# Patient Record
Sex: Male | Born: 1970 | Race: Black or African American | Hispanic: No | Marital: Single | Smoking: Current some day smoker
Health system: Southern US, Community
[De-identification: ages and names within clinical notes are randomized; demographics above are authoritative.]

## PROBLEM LIST (undated history)

## (undated) DIAGNOSIS — M549 Dorsalgia, unspecified: Secondary | ICD-10-CM

---

## 2008-04-13 ENCOUNTER — Emergency Department (HOSPITAL_COMMUNITY): Admission: EM | Admit: 2008-04-13 | Discharge: 2008-04-13 | Payer: Self-pay | Admitting: Family Medicine

## 2008-05-24 ENCOUNTER — Emergency Department (HOSPITAL_COMMUNITY): Admission: EM | Admit: 2008-05-24 | Discharge: 2008-05-24 | Payer: Self-pay | Admitting: Emergency Medicine

## 2008-06-25 ENCOUNTER — Emergency Department (HOSPITAL_COMMUNITY): Admission: EM | Admit: 2008-06-25 | Discharge: 2008-06-25 | Payer: Self-pay | Admitting: Emergency Medicine

## 2008-06-29 ENCOUNTER — Emergency Department (HOSPITAL_COMMUNITY): Admission: EM | Admit: 2008-06-29 | Discharge: 2008-06-29 | Payer: Self-pay | Admitting: Emergency Medicine

## 2008-07-31 ENCOUNTER — Emergency Department (HOSPITAL_COMMUNITY): Admission: EM | Admit: 2008-07-31 | Discharge: 2008-07-31 | Payer: Self-pay | Admitting: Emergency Medicine

## 2008-12-24 ENCOUNTER — Emergency Department (HOSPITAL_COMMUNITY): Admission: EM | Admit: 2008-12-24 | Discharge: 2008-12-24 | Payer: Self-pay | Admitting: Family Medicine

## 2009-01-02 ENCOUNTER — Emergency Department (HOSPITAL_COMMUNITY): Admission: EM | Admit: 2009-01-02 | Discharge: 2009-01-02 | Payer: Self-pay | Admitting: Emergency Medicine

## 2009-03-02 ENCOUNTER — Emergency Department (HOSPITAL_COMMUNITY): Admission: EM | Admit: 2009-03-02 | Discharge: 2009-03-02 | Payer: Self-pay | Admitting: Family Medicine

## 2009-07-20 ENCOUNTER — Emergency Department (HOSPITAL_COMMUNITY): Admission: EM | Admit: 2009-07-20 | Discharge: 2009-07-20 | Payer: Self-pay | Admitting: Emergency Medicine

## 2009-09-03 ENCOUNTER — Emergency Department (HOSPITAL_COMMUNITY): Admission: EM | Admit: 2009-09-03 | Discharge: 2009-09-03 | Payer: Self-pay | Admitting: Emergency Medicine

## 2009-09-08 ENCOUNTER — Emergency Department (HOSPITAL_COMMUNITY): Admission: EM | Admit: 2009-09-08 | Discharge: 2009-09-08 | Payer: Self-pay | Admitting: Emergency Medicine

## 2009-09-21 ENCOUNTER — Emergency Department (HOSPITAL_COMMUNITY): Admission: EM | Admit: 2009-09-21 | Discharge: 2009-09-21 | Payer: Self-pay | Admitting: Emergency Medicine

## 2010-02-10 ENCOUNTER — Emergency Department (HOSPITAL_COMMUNITY): Admission: EM | Admit: 2010-02-10 | Discharge: 2010-02-10 | Payer: Self-pay | Admitting: Family Medicine

## 2010-02-24 ENCOUNTER — Emergency Department (HOSPITAL_COMMUNITY): Admission: EM | Admit: 2010-02-24 | Discharge: 2010-02-24 | Payer: Self-pay | Admitting: Emergency Medicine

## 2010-02-26 ENCOUNTER — Emergency Department (HOSPITAL_COMMUNITY): Admission: EM | Admit: 2010-02-26 | Discharge: 2010-02-27 | Payer: Self-pay | Admitting: Emergency Medicine

## 2010-03-03 ENCOUNTER — Emergency Department (HOSPITAL_COMMUNITY): Admission: EM | Admit: 2010-03-03 | Discharge: 2010-03-03 | Payer: Self-pay | Admitting: Family Medicine

## 2010-03-11 ENCOUNTER — Emergency Department (HOSPITAL_COMMUNITY): Admission: EM | Admit: 2010-03-11 | Discharge: 2010-03-11 | Payer: Self-pay | Admitting: Emergency Medicine

## 2010-03-15 ENCOUNTER — Emergency Department (HOSPITAL_COMMUNITY): Admission: EM | Admit: 2010-03-15 | Discharge: 2010-03-15 | Payer: Self-pay | Admitting: Family Medicine

## 2010-03-17 ENCOUNTER — Emergency Department (HOSPITAL_COMMUNITY): Admission: EM | Admit: 2010-03-17 | Discharge: 2010-03-17 | Payer: Self-pay | Admitting: Emergency Medicine

## 2010-04-02 ENCOUNTER — Emergency Department (HOSPITAL_COMMUNITY): Admission: EM | Admit: 2010-04-02 | Discharge: 2010-04-02 | Payer: Self-pay | Admitting: Family Medicine

## 2010-08-22 ENCOUNTER — Emergency Department (HOSPITAL_COMMUNITY): Admission: EM | Admit: 2010-08-22 | Discharge: 2010-08-22 | Payer: Self-pay | Admitting: Family Medicine
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
## 2010-12-13 ENCOUNTER — Emergency Department (HOSPITAL_COMMUNITY)
Admission: EM | Admit: 2010-12-13 | Discharge: 2010-12-13 | Payer: Self-pay | Source: Home / Self Care | Admitting: Emergency Medicine

## 2011-01-10 ENCOUNTER — Inpatient Hospital Stay (INDEPENDENT_AMBULATORY_CARE_PROVIDER_SITE_OTHER)
Admission: RE | Admit: 2011-01-10 | Discharge: 2011-01-10 | Disposition: A | Discharge status: Home / Self Care | Payer: Self-pay | Source: Ambulatory Visit | Attending: Family Medicine | Admitting: Family Medicine

## 2011-01-10 DIAGNOSIS — K047 Periapical abscess without sinus: Secondary | ICD-10-CM

## 2011-02-04 LAB — GC/CHLAMYDIA PROBE AMP, GENITAL
Chlamydia, DNA Probe: NEGATIVE
GC Probe Amp, Genital: NEGATIVE

## 2011-02-09 LAB — CULTURE, ROUTINE-ABSCESS

## 2011-02-14 LAB — DIFFERENTIAL
Basophils Relative: 1 % (ref 0–1)
Eosinophils Absolute: 0.1 10*3/uL (ref 0.0–0.7)
Lymphs Abs: 2 10*3/uL (ref 0.7–4.0)
Monocytes Relative: 8 % (ref 3–12)

## 2011-02-14 LAB — POCT URINALYSIS DIP (DEVICE)
Bilirubin Urine: NEGATIVE
Glucose, UA: NEGATIVE mg/dL
Hgb urine dipstick: NEGATIVE
Nitrite: NEGATIVE
Protein, ur: NEGATIVE mg/dL
Specific Gravity, Urine: 1.025 (ref 1.005–1.030)
pH: 5.5 (ref 5.0–8.0)

## 2011-02-14 LAB — POCT I-STAT, CHEM 8
Creatinine, Ser: 1.1 mg/dL (ref 0.4–1.5)
Glucose, Bld: 111 mg/dL — ABNORMAL HIGH (ref 70–99)
HCT: 44 % (ref 39.0–52.0)

## 2011-02-14 LAB — RAPID URINE DRUG SCREEN, HOSP PERFORMED
Barbiturates: NOT DETECTED
Cocaine: NOT DETECTED
Tetrahydrocannabinol: POSITIVE — AB

## 2011-02-14 LAB — HIV ANTIBODY (ROUTINE TESTING W REFLEX): HIV: NONREACTIVE

## 2011-02-14 LAB — CBC
MCV: 100 fL (ref 78.0–100.0)
Platelets: 153 10*3/uL (ref 150–400)
WBC: 4.6 10*3/uL (ref 4.0–10.5)

## 2011-02-14 LAB — GC/CHLAMYDIA PROBE AMP, GENITAL
Chlamydia, DNA Probe: NEGATIVE
GC Probe Amp, Genital: NEGATIVE

## 2011-03-03 LAB — GC/CHLAMYDIA PROBE AMP, GENITAL
Chlamydia, DNA Probe: NEGATIVE
GC Probe Amp, Genital: NEGATIVE

## 2011-03-09 LAB — GC/CHLAMYDIA PROBE AMP, GENITAL
Chlamydia, DNA Probe: NEGATIVE
GC Probe Amp, Genital: NEGATIVE

## 2011-03-09 LAB — RPR: RPR Ser Ql: NONREACTIVE

## 2011-03-13 ENCOUNTER — Inpatient Hospital Stay (INDEPENDENT_AMBULATORY_CARE_PROVIDER_SITE_OTHER)
Admission: RE | Admit: 2011-03-13 | Discharge: 2011-03-13 | Disposition: A | Discharge status: Home / Self Care | Payer: Self-pay | Source: Ambulatory Visit | Attending: Family Medicine | Admitting: Family Medicine

## 2011-03-13 DIAGNOSIS — K089 Disorder of teeth and supporting structures, unspecified: Secondary | ICD-10-CM

## 2011-04-11 ENCOUNTER — Inpatient Hospital Stay (INDEPENDENT_AMBULATORY_CARE_PROVIDER_SITE_OTHER)
Admission: RE | Admit: 2011-04-11 | Discharge: 2011-04-11 | Disposition: A | Discharge status: Home / Self Care | Payer: Self-pay | Source: Ambulatory Visit | Attending: Emergency Medicine | Admitting: Emergency Medicine

## 2011-04-11 DIAGNOSIS — M799 Soft tissue disorder, unspecified: Secondary | ICD-10-CM

## 2011-04-11 DIAGNOSIS — R070 Pain in throat: Secondary | ICD-10-CM

## 2011-07-25 ENCOUNTER — Inpatient Hospital Stay (INDEPENDENT_AMBULATORY_CARE_PROVIDER_SITE_OTHER)
Admission: RE | Admit: 2011-07-25 | Discharge: 2011-07-25 | Disposition: A | Discharge status: Home / Self Care | Payer: Self-pay | Source: Ambulatory Visit | Attending: Family Medicine | Admitting: Family Medicine

## 2011-07-25 DIAGNOSIS — K589 Irritable bowel syndrome without diarrhea: Secondary | ICD-10-CM

## 2011-07-25 LAB — POCT I-STAT, CHEM 8
Calcium, Ion: 1.13 mmol/L (ref 1.12–1.32)
Chloride: 108 mEq/L (ref 96–112)
HCT: 40 % (ref 39.0–52.0)
Potassium: 4 mEq/L (ref 3.5–5.1)
Sodium: 143 mEq/L (ref 135–145)
TCO2: 24 mmol/L (ref 0–100)

## 2011-07-25 LAB — OCCULT BLOOD, POC DEVICE: Fecal Occult Bld: NEGATIVE

## 2011-08-14 ENCOUNTER — Inpatient Hospital Stay (INDEPENDENT_AMBULATORY_CARE_PROVIDER_SITE_OTHER)
Admission: RE | Admit: 2011-08-14 | Discharge: 2011-08-14 | Disposition: A | Discharge status: Home / Self Care | Payer: Self-pay | Source: Ambulatory Visit | Attending: Emergency Medicine | Admitting: Emergency Medicine

## 2011-08-14 DIAGNOSIS — K029 Dental caries, unspecified: Secondary | ICD-10-CM

## 2011-09-21 ENCOUNTER — Inpatient Hospital Stay (INDEPENDENT_AMBULATORY_CARE_PROVIDER_SITE_OTHER)
Admission: RE | Admit: 2011-09-21 | Discharge: 2011-09-21 | Disposition: A | Discharge status: Home / Self Care | Payer: Self-pay | Source: Ambulatory Visit | Attending: Family Medicine | Admitting: Family Medicine

## 2011-09-21 DIAGNOSIS — K089 Disorder of teeth and supporting structures, unspecified: Secondary | ICD-10-CM

## 2011-09-21 DIAGNOSIS — K029 Dental caries, unspecified: Secondary | ICD-10-CM

## 2011-09-21 DIAGNOSIS — S025XXA Fracture of tooth (traumatic), initial encounter for closed fracture: Secondary | ICD-10-CM

## 2011-11-02 ENCOUNTER — Emergency Department (HOSPITAL_COMMUNITY)
Admission: EM | Admit: 2011-11-02 | Discharge: 2011-11-02 | Discharge status: Against Medical Advice | Payer: No Typology Code available for payment source

## 2011-11-02 ENCOUNTER — Encounter: Payer: Self-pay | Admitting: Emergency Medicine

## 2011-11-02 ENCOUNTER — Emergency Department (HOSPITAL_COMMUNITY)
Admission: EM | Admit: 2011-11-02 | Discharge: 2011-11-03 | Disposition: A | Discharge status: Home / Self Care | Payer: No Typology Code available for payment source | Attending: Emergency Medicine | Admitting: Emergency Medicine

## 2011-11-02 DIAGNOSIS — J111 Influenza due to unidentified influenza virus with other respiratory manifestations: Secondary | ICD-10-CM | POA: Insufficient documentation

## 2011-11-02 DIAGNOSIS — S199XXA Unspecified injury of neck, initial encounter: Secondary | ICD-10-CM | POA: Insufficient documentation

## 2011-11-02 DIAGNOSIS — IMO0001 Reserved for inherently not codable concepts without codable children: Secondary | ICD-10-CM | POA: Insufficient documentation

## 2011-11-02 DIAGNOSIS — R509 Fever, unspecified: Secondary | ICD-10-CM | POA: Insufficient documentation

## 2011-11-02 DIAGNOSIS — F17200 Nicotine dependence, unspecified, uncomplicated: Secondary | ICD-10-CM | POA: Insufficient documentation

## 2011-11-02 DIAGNOSIS — S0993XA Unspecified injury of face, initial encounter: Secondary | ICD-10-CM | POA: Insufficient documentation

## 2011-11-02 DIAGNOSIS — M542 Cervicalgia: Secondary | ICD-10-CM | POA: Insufficient documentation

## 2011-11-02 DIAGNOSIS — M549 Dorsalgia, unspecified: Secondary | ICD-10-CM | POA: Insufficient documentation

## 2011-11-02 DIAGNOSIS — R51 Headache: Secondary | ICD-10-CM | POA: Insufficient documentation

## 2011-11-02 DIAGNOSIS — R61 Generalized hyperhidrosis: Secondary | ICD-10-CM | POA: Insufficient documentation

## 2011-11-03 ENCOUNTER — Emergency Department (HOSPITAL_COMMUNITY): Payer: Self-pay

## 2011-11-03 ENCOUNTER — Emergency Department (HOSPITAL_COMMUNITY): Payer: No Typology Code available for payment source

## 2011-11-03 ENCOUNTER — Encounter (HOSPITAL_COMMUNITY): Payer: Self-pay | Admitting: Radiology

## 2011-11-03 MED ORDER — OSELTAMIVIR PHOSPHATE 75 MG PO CAPS
75.0000 mg | ORAL_CAPSULE | ORAL | Status: AC
  Administered 2011-11-03: 75 mg via ORAL
  Filled 2011-11-03: qty 1

## 2011-11-03 MED ORDER — OSELTAMIVIR PHOSPHATE 75 MG PO CAPS: 75.0000 mg | ORAL_CAPSULE | Freq: Two times a day (BID) | ORAL | Status: AC

## 2011-11-03 MED ORDER — ONDANSETRON 4 MG PO TBDP
4.0000 mg | ORAL_TABLET | Freq: Once | ORAL | Status: AC
  Administered 2011-11-03: 4 mg via ORAL
  Filled 2011-11-03: qty 1

## 2011-11-03 MED ORDER — IBUPROFEN 800 MG PO TABS
800.0000 mg | ORAL_TABLET | Freq: Once | ORAL | Status: DC
  Filled 2011-11-03: qty 1

## 2011-11-03 MED ORDER — OXYCODONE HCL 5 MG PO TABS: 5.0000 mg | ORAL_TABLET | ORAL | Status: AC | PRN

## 2011-11-03 MED ORDER — MORPHINE SULFATE 4 MG/ML IJ SOLN
6.0000 mg | Freq: Once | INTRAMUSCULAR | Status: AC
  Administered 2011-11-03: 4 mg via INTRAVENOUS
  Filled 2011-11-03: qty 1
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
## 2011-11-17 NOTE — ED Provider Notes (Signed)
Medical screening examination/treatment/procedure(s) were performed by non-physician practitioner and as supervising physician I was immediately available for consultation/collaboration.   Catina Nuss E Marieanne Marxen, MD 11/17/11 0736 

## 2012-03-25 ENCOUNTER — Emergency Department (INDEPENDENT_AMBULATORY_CARE_PROVIDER_SITE_OTHER)
Admission: EM | Admit: 2012-03-25 | Discharge: 2012-03-25 | Disposition: A | Discharge status: Nursing Home (Includes ICF) | Payer: Self-pay | Source: Home / Self Care | Attending: Family Medicine | Admitting: Family Medicine

## 2012-03-25 ENCOUNTER — Emergency Department (HOSPITAL_COMMUNITY)
Admission: EM | Admit: 2012-03-25 | Discharge: 2012-03-25 | Disposition: A | Discharge status: Home / Self Care | Payer: Self-pay | Attending: Emergency Medicine | Admitting: Emergency Medicine

## 2012-03-25 ENCOUNTER — Emergency Department (HOSPITAL_COMMUNITY): Payer: Self-pay

## 2012-03-25 ENCOUNTER — Encounter (HOSPITAL_COMMUNITY): Payer: Self-pay | Admitting: *Deleted

## 2012-03-25 ENCOUNTER — Encounter (HOSPITAL_COMMUNITY): Payer: Self-pay | Admitting: Emergency Medicine

## 2012-03-25 DIAGNOSIS — R109 Unspecified abdominal pain: Secondary | ICD-10-CM | POA: Insufficient documentation

## 2012-03-25 DIAGNOSIS — G8929 Other chronic pain: Secondary | ICD-10-CM | POA: Insufficient documentation

## 2012-03-25 LAB — URINALYSIS, ROUTINE W REFLEX MICROSCOPIC
Ketones, ur: 40 mg/dL — AB
Leukocytes, UA: NEGATIVE
Nitrite: NEGATIVE
Protein, ur: NEGATIVE mg/dL
Urobilinogen, UA: 0.2 mg/dL (ref 0.0–1.0)

## 2012-03-25 LAB — CBC
HCT: 40.7 % (ref 39.0–52.0)
Hemoglobin: 14.6 g/dL (ref 13.0–17.0)
MCH: 33.8 pg (ref 26.0–34.0)
MCHC: 35.9 g/dL (ref 30.0–36.0)
RBC: 4.32 MIL/uL (ref 4.22–5.81)

## 2012-03-25 LAB — COMPREHENSIVE METABOLIC PANEL
Alkaline Phosphatase: 63 U/L (ref 39–117)
BUN: 14 mg/dL (ref 6–23)
Chloride: 105 mEq/L (ref 96–112)
Creatinine, Ser: 1 mg/dL (ref 0.50–1.35)
GFR calc Af Amer: >90 mL/min (ref >90)
Glucose, Bld: 68 mg/dL — ABNORMAL LOW (ref 70–99)
Potassium: 5.1 mEq/L (ref 3.5–5.1)
Total Bilirubin: 0.5 mg/dL (ref 0.3–1.2)
Total Protein: 7 g/dL (ref 6.0–8.3)

## 2012-03-25 LAB — DIFFERENTIAL
Basophils Relative: 0 % (ref 0–1)
Eosinophils Absolute: 0 10*3/uL (ref 0.0–0.7)
Lymphs Abs: 2.8 10*3/uL (ref 0.7–4.0)
Monocytes Absolute: 0.5 10*3/uL (ref 0.1–1.0)
Monocytes Relative: 10 % (ref 3–12)
Neutrophils Relative %: 37 % — ABNORMAL LOW (ref 43–77)

## 2012-03-25 MED ORDER — IOHEXOL 300 MG/ML  SOLN
100.0000 mL | Freq: Once | INTRAMUSCULAR | Status: AC | PRN
  Administered 2012-03-25: 100 mL via INTRAVENOUS

## 2012-03-25 MED ORDER — SODIUM CHLORIDE 0.9 % IV SOLN
Freq: Once | INTRAVENOUS | Status: AC
  Administered 2012-03-25: 15:00:00 via INTRAVENOUS

## 2012-03-25 MED ORDER — SODIUM CHLORIDE 0.9 % IV BOLUS (SEPSIS)
1000.0000 mL | Freq: Once | INTRAVENOUS | Status: AC
  Administered 2012-03-25: 1000 mL via INTRAVENOUS

## 2012-03-25 MED ORDER — IOHEXOL 300 MG/ML  SOLN
20.0000 mL | INTRAMUSCULAR | Status: AC
  Administered 2012-03-25 (×2): 20 mL via ORAL

## 2012-03-25 MED ORDER — MORPHINE SULFATE 4 MG/ML IJ SOLN
4.0000 mg | Freq: Once | INTRAMUSCULAR | Status: AC
  Administered 2012-03-25: 4 mg via INTRAVENOUS
  Filled 2012-03-25: qty 1

## 2012-03-25 MED ORDER — OXYCODONE HCL 5 MG PO TABS: 5.0000 mg | ORAL_TABLET | Freq: Four times a day (QID) | ORAL | Status: AC | PRN

## 2012-03-25 NOTE — ED Notes (Signed)
Patient in bathroom

## 2012-03-25 NOTE — ED Notes (Signed)
Patient placed in gown for physician examination

## 2012-03-25 NOTE — Discharge Instructions (Signed)
Transferred to [facility] Emergency Department via shuttle for further evaluation of abdominal pain.

## 2012-03-25 NOTE — Discharge Instructions (Signed)
Follow-up with the GI doctor provided.  Return here as needed. °

## 2012-03-25 NOTE — ED Provider Notes (Signed)
  Physical Exam  BP 129/62  Pulse 50  Temp(Src) 97.9 F (36.6 C) (Oral)  Resp 18  SpO2 99%  Physical Exam  ED Course  Procedures  Patient will be referred to GI. Told to return here as needed. He is given the plan and all questions answered. Patient has eaten here as well.     Carlyle Dolly, PA-C 03/25/12 1758

## 2012-03-25 NOTE — ED Notes (Signed)
Pt up to bathroom to give urine sample

## 2012-03-25 NOTE — ED Notes (Signed)
To ED for eval of abd pain and diarrhea since Dec. Sent from [facility] for further eval.

## 2012-03-25 NOTE — ED Notes (Addendum)
Patient reports intermittent abdominal pain for 2 weeks, pain worsened 3 days ago and became constant.  Patient reports diarrhea for a month.  Reports 3 episodes of diarrhea today.  Reports dark urine and difficulty starting urination reports 25 pound weight loss on last month.  Reports eating results in having a diarrhea stool.  Reports green/red stool

## 2012-03-25 NOTE — ED Provider Notes (Signed)
History     CSN: [id number]  Arrival date & time 03/25/12  1021   First MD Initiated Contact with Patient 03/25/12 1049      Chief Complaint  Patient presents with  . Abdominal Pain    (Consider location/radiation/quality/duration/timing/severity/associated sxs/prior treatment) HPI Comments: Roy Barry presents for evaluation of left-sided abdominal pain. He reports onset of symptoms, at first, over the last 3 weeks, with worsening over the last 3 days. Under further interview, he reports onset of symptoms in December. He states that they have subsided and improved somewhat over that time. He now reports recurrence of symptoms over the last 3 weeks, with constant and persistent pain. He reports multiple watery, loose bowel movements daily. He also now reports seeing blood intermittently with these bowel movements. He denies any nausea or vomiting and is eating and drinking well. He also continues to urinate well. He denies any fever. He said the pain is worsened with eating, and sometimes relieved with defecation. He denies any prior history.  Patient is a 41 y.o. male presenting with abdominal pain.  Abdominal Pain The primary symptoms of the illness include abdominal pain and diarrhea. The primary symptoms of the illness do not include fever, nausea, vomiting or hematemesis. The current episode started more than 2 days ago. The onset of the illness was sudden. The problem has been gradually worsening.  The abdominal pain began more than 2 days ago. The pain came on suddenly. The abdominal pain has been gradually worsening since its onset. The abdominal pain is located in the LUQ and LLQ. The abdominal pain does not radiate. The abdominal pain is relieved by bowel movement. The abdominal pain is exacerbated by eating.  The diarrhea began more than 1 week ago. The diarrhea is watery, blood-tinged and semi-solid. The diarrhea occurs 5 to 10 times per day.    History reviewed. No pertinent past  medical history.  History reviewed. No pertinent past surgical history.  No family history on file.  History  Substance Use Topics  . Smoking status: Current Everyday Smoker  . Smokeless tobacco: Not on file  . Alcohol Use: Yes     occassional      Review of Systems  Constitutional: Negative.  Negative for fever.  HENT: Negative.   Eyes: Negative.   Respiratory: Negative.   Cardiovascular: Negative.   Gastrointestinal: Positive for abdominal pain, diarrhea and blood in stool. Negative for nausea, vomiting and hematemesis.  Genitourinary: Negative.   Musculoskeletal: Negative.   Skin: Negative.   Neurological: Negative.     Allergies  Aspirin; Ibuprofen; Thorazine; and Tylenol  Home Medications  No current outpatient prescriptions on file.  BP 127/79  Pulse 68  Temp(Src) 98 F (36.7 C) (Oral)  Resp 20  SpO2 98%  Physical Exam  Nursing note and vitals reviewed. Constitutional: He is oriented to person, place, and time. He appears well-developed and well-nourished.  HENT:  Head: Normocephalic and atraumatic.  Eyes: EOM are normal.  Neck: Normal range of motion.  Pulmonary/Chest: Effort normal.  Abdominal: Soft. Normal appearance and bowel sounds are normal. There is tenderness in the left upper quadrant and left lower quadrant. There is no rebound and no guarding.  Musculoskeletal: Normal range of motion.  Neurological: He is alert and oriented to person, place, and time.  Skin: Skin is warm and dry.  Psychiatric: His behavior is normal.    ED Course  Procedures (including critical care time)  Labs Reviewed - No data to display No results found.  1. Abdominal pain       MDM  Transferred to ED for further evaluation        Renaee Munda, MD 03/25/12 1233

## 2012-03-25 NOTE — ED Provider Notes (Signed)
History   This chart was scribed for Nat Christen, MD by Melba Coon. The patient was seen in room STRE3/STRE3 and the patient's care was started at 2:10PM.    CSN: [id number]  Arrival date & time 03/25/12  1302   First MD Initiated Contact with Patient 03/25/12 1407      Chief Complaint  Patient presents with  . Abdominal Pain    (Consider location/radiation/quality/duration/timing/severity/associated sxs/prior treatment) HPI Roy Barry is a 41 y.o. male who presents to the Emergency Department complaining of constant, moderate to severe abdominal pain with an onset 3 days ago. Pt was over at Urgent Care earlier today and was sent over here to the ED. Pt has had chronic abd pain since December but has gotten progressively worse since onset. Pt states that he "uses the bathroom 7 times a day" and that he has lost about 25 lbs over the last month. Decreased amount of food consumption, but he states he wants to eat. No OTC meds taken at home. Slight hematochezia present. No hematuria. No HA, fever, neck pain, sore throat, rash, back pain, CP, SOB, emesis, dysuria, or extremity pain, edema, weakness, numbness, or tingling. No Hx of abd surgeries. Pt is allergic to ASA, ibuprofen, thorazine, and Tylenol. No other pertinent medical symptoms.  History reviewed. No pertinent past medical history.  No past surgical history on file.  History reviewed. No pertinent family history.  History  Substance Use Topics  . Smoking status: Current Everyday Smoker  . Smokeless tobacco: Not on file  . Alcohol Use: Yes     occassional      Review of Systems 10 Systems reviewed and all are negative for acute change except as noted in the HPI.   Allergies  Aspirin; Ibuprofen; Thorazine; and Tylenol  Home Medications  No current outpatient prescriptions on file.  BP 112/55  Pulse 50  Temp(Src) 97.9 F (36.6 C) (Oral)  Resp 16  SpO2 99%  Physical Exam  Nursing note and  vitals reviewed. Constitutional: He is oriented to person, place, and time. He appears well-developed and well-nourished. No distress.  HENT:  Head: Normocephalic and atraumatic.  Eyes: EOM are normal. Pupils are equal, round, and reactive to light.  Neck: Normal range of motion. Neck supple. No tracheal deviation present.  Cardiovascular: Normal rate.   No murmur heard. Pulmonary/Chest: Effort normal. No respiratory distress.  Abdominal: Soft. He exhibits no distension. There is tenderness (LLQ tenderness). There is no rebound and no guarding.  Musculoskeletal: Normal range of motion.  Neurological: He is alert and oriented to person, place, and time.  Skin: Skin is warm and dry.  Psychiatric: He has a normal mood and affect. His behavior is normal.    ED Course  Procedures (including critical care time)  DIAGNOSTIC STUDIES: Oxygen Saturation is 99% on room air, normal by my interpretation.    COORDINATION OF CARE:  2:15PM - EDMD will order pain meds, IV fluids,abd Ct, and blood w/u for the pt.   Labs Reviewed - No data to display No results found.   No diagnosis found.    MDM  Patient with symptoms concerning for possible diverticulitis or other syndrome such as a mesenteric blood flow problem.  Patient is afebrile here.  Vital signs are otherwise normal.  Patient with laboratory studies completed as well as a CT of the pelvis and further evaluate for problems with the mesenteric blood flow as well as signs of infection.  Patient will go to CDU  while pending the rest of his studies.  I personally performed the services described in this documentation, which was scribed in my presence. The recorded information has been reviewed and considered.        Nat Christen, MD 03/25/12 1425

## 2012-03-25 NOTE — ED Provider Notes (Signed)
Medical screening examination/treatment/procedure(s) were conducted as a shared visit with non-physician practitioner(s) and myself.  I personally evaluated the patient during the encounter   Nat Christen, MD 03/25/12 [phone]

## 2012-05-22 ENCOUNTER — Encounter (HOSPITAL_COMMUNITY): Payer: Self-pay | Admitting: *Deleted

## 2012-05-22 ENCOUNTER — Emergency Department (HOSPITAL_COMMUNITY)
Admission: EM | Admit: 2012-05-22 | Discharge: 2012-05-23 | Disposition: A | Discharge status: Home / Self Care | Payer: Self-pay | Attending: Emergency Medicine | Admitting: Emergency Medicine

## 2012-05-22 DIAGNOSIS — R1110 Vomiting, unspecified: Secondary | ICD-10-CM | POA: Insufficient documentation

## 2012-05-22 DIAGNOSIS — F17200 Nicotine dependence, unspecified, uncomplicated: Secondary | ICD-10-CM | POA: Insufficient documentation

## 2012-05-22 DIAGNOSIS — R109 Unspecified abdominal pain: Secondary | ICD-10-CM | POA: Insufficient documentation

## 2012-05-22 LAB — CBC WITH DIFFERENTIAL/PLATELET
Basophils Absolute: 0 10*3/uL (ref 0.0–0.1)
Basophils Relative: 0 % (ref 0–1)
MCHC: 36.3 g/dL — ABNORMAL HIGH (ref 30.0–36.0)
Monocytes Absolute: 0.3 10*3/uL (ref 0.1–1.0)
Neutro Abs: 2 10*3/uL (ref 1.7–7.7)
Neutrophils Relative %: 38 % — ABNORMAL LOW (ref 43–77)
Platelets: 144 10*3/uL — ABNORMAL LOW (ref 150–400)
RDW: 12.4 % (ref 11.5–15.5)
WBC: 5.3 10*3/uL (ref 4.0–10.5)

## 2012-05-22 LAB — BASIC METABOLIC PANEL
Chloride: 106 mEq/L (ref 96–112)
Creatinine, Ser: 1.14 mg/dL (ref 0.50–1.35)
GFR calc Af Amer: >90 mL/min (ref >90)
Potassium: 3.9 mEq/L (ref 3.5–5.1)
Sodium: 139 mEq/L (ref 135–145)

## 2012-05-22 LAB — LIPASE, BLOOD: Lipase: 33 U/L (ref 11–59)

## 2012-05-22 MED ORDER — SODIUM CHLORIDE 0.9 % IV BOLUS (SEPSIS)
1000.0000 mL | Freq: Once | INTRAVENOUS | Status: AC
  Administered 2012-05-22: 1000 mL via INTRAVENOUS

## 2012-05-22 MED ORDER — SODIUM CHLORIDE 0.9 % IV SOLN: Freq: Once | INTRAVENOUS | Status: DC

## 2012-05-22 MED ORDER — GI COCKTAIL ~~LOC~~
30.0000 mL | Freq: Once | ORAL | Status: AC
  Administered 2012-05-23: 30 mL via ORAL
  Filled 2012-05-22: qty 30

## 2012-05-22 MED ORDER — ONDANSETRON 8 MG PO TBDP
8.0000 mg | ORAL_TABLET | Freq: Once | ORAL | Status: AC
  Administered 2012-05-22: 8 mg via ORAL
  Filled 2012-05-22: qty 1

## 2012-05-22 MED ORDER — FAMOTIDINE 20 MG PO TABS
40.0000 mg | ORAL_TABLET | Freq: Once | ORAL | Status: AC
Start: 2012-05-23 — End: 2012-05-23
  Administered 2012-05-23: 40 mg via ORAL
  Filled 2012-05-22: qty 2

## 2012-05-22 NOTE — ED Notes (Signed)
Pt reports n/v/d x1 day. LLQ "sharp" abd pain present. Denies otc meds

## 2012-05-22 NOTE — ED Notes (Signed)
Hyperactive bowel sounds in all 4 quadrents

## 2012-05-22 NOTE — ED Notes (Signed)
Pt reports appetite has been poor for past few weeks. States he cannot keep food down. Pt thinks he may have food poisoning

## 2012-05-22 NOTE — ED Provider Notes (Signed)
History     CSN: [id number]  Arrival date & time 05/22/12  1733   First MD Initiated Contact with Patient 05/22/12 2303      Chief Complaint  Patient presents with  . Emesis  . Abdominal Pain    (Consider location/radiation/quality/duration/timing/severity/associated sxs/prior treatment) Patient is a 41 y.o. male presenting with vomiting and abdominal pain. The history is provided by the patient.  Emesis  Associated symptoms include abdominal pain.  Abdominal Pain The primary symptoms of the illness include abdominal pain and vomiting.   here with pain is left upper quadrant x6 months. Seen at [facility] course to him and had negative workup. Pain has been worse with eating and better with nothing. No fever but some non-bilious vomiting. Slight diarrhea without blood in the stools. Nothing makes her symptoms better. No urinary symptoms. No prior history abdominal surgeries. Pain described as sharp.  History reviewed. No pertinent past medical history.  History reviewed. No pertinent past surgical history.  No family history on file.  History  Substance Use Topics  . Smoking status: Current Everyday Smoker    Types: Cigarettes  . Smokeless tobacco: Not on file  . Alcohol Use: Yes     occassional      Review of Systems  Gastrointestinal: Positive for vomiting and abdominal pain.  All other systems reviewed and are negative.    Allergies  Aspirin; Ibuprofen; Thorazine; and Tylenol  Home Medications  No current outpatient prescriptions on file.  BP 116/64  Pulse 73  Temp 98.3 F (36.8 C) (Oral)  Resp 14  Ht 6' (1.829 m)  Wt 165 lb (74.844 kg)  BMI 22.38 kg/m2  SpO2 99%  Physical Exam  Nursing note and vitals reviewed. Constitutional: He is oriented to person, place, and time. He appears well-developed and well-nourished.  Non-toxic appearance. No distress.  HENT:  Head: Normocephalic and atraumatic.  Eyes: Conjunctivae, EOM and lids are normal.  Pupils are equal, round, and reactive to light.  Neck: Normal range of motion. Neck supple. No tracheal deviation present. No mass present.  Cardiovascular: Normal rate, regular rhythm and normal heart sounds.  Exam reveals no gallop.   No murmur heard. Pulmonary/Chest: Effort normal and breath sounds normal. No stridor. No respiratory distress. He has no decreased breath sounds. He has no wheezes. He has no rhonchi. He has no rales.  Abdominal: Soft. Normal appearance and bowel sounds are normal. He exhibits no distension. There is tenderness in the left upper quadrant. There is guarding. There is no rigidity, no rebound and no CVA tenderness.  Musculoskeletal: Normal range of motion. He exhibits no edema and no tenderness.  Neurological: He is alert and oriented to person, place, and time. He has normal strength. No cranial nerve deficit or sensory deficit. GCS eye subscore is 4. GCS verbal subscore is 5. GCS motor subscore is 6.  Skin: Skin is warm and dry. No abrasion and no rash noted.  Psychiatric: He has a normal mood and affect. His speech is normal and behavior is normal.    ED Course  Procedures (including critical care time)  Labs Reviewed  CBC WITH DIFFERENTIAL - Abnormal; Notable for the following:    RBC 4.00 (*)     HCT 36.6 (*)     MCHC 36.3 (*)     Platelets 144 (*)     Neutrophils Relative 38 (*)     Lymphocytes Relative 55 (*)     All other components within normal limits  BASIC METABOLIC PANEL - Abnormal; Notable for the following:    Glucose, Bld 116 (*)     GFR calc non Af Amer 80 (*)     All other components within normal limits  SPECIMEN HOLD  LIPASE, BLOOD  HEPATIC FUNCTION PANEL   No results found.   No diagnosis found.    MDM  Patient given medications for reflux and does flow better at this time. No concern for surgical process. Will discharge to home        Toy Baker, MD 05/23/12 [phone]

## 2012-05-23 LAB — HEPATIC FUNCTION PANEL
AST: 27 U/L (ref 0–37)
Bilirubin, Direct: <0.1 mg/dL (ref 0.0–0.3)
Total Protein: 6.8 g/dL (ref 6.0–8.3)

## 2012-05-23 MED ORDER — FAMOTIDINE 20 MG PO TABS: 20.0000 mg | ORAL_TABLET | Freq: Two times a day (BID) | ORAL | Status: DC

## 2012-05-23 MED ORDER — SUCRALFATE 1 G PO TABS: 1.0000 g | ORAL_TABLET | Freq: Four times a day (QID) | ORAL | Status: DC

## 2012-05-23 NOTE — Discharge Instructions (Signed)
Abdominal Pain Abdominal pain can be caused by many things. Your caregiver decides the seriousness of your pain by an examination and possibly blood tests and X-rays. Many cases can be observed and treated at home. Most abdominal pain is not caused by a disease and will probably improve without treatment. However, in many cases, more time must pass before a clear cause of the pain can be found. Before that point, it may not be known if you need more testing, or if hospitalization or surgery is needed. HOME CARE INSTRUCTIONS   Do not take laxatives unless directed by your caregiver.   Take pain medicine only as directed by your caregiver.   Only take over-the-counter or prescription medicines for pain, discomfort, or fever as directed by your caregiver.   Try a clear liquid diet (broth, tea, or water) for as long as directed by your caregiver. Slowly move to a bland diet as tolerated.  SEEK IMMEDIATE MEDICAL CARE IF:   The pain does not go away.   You have a fever.   You keep throwing up (vomiting).   The pain is felt only in portions of the abdomen. Pain in the right side could possibly be appendicitis. In an adult, pain in the left lower portion of the abdomen could be colitis or diverticulitis.   You pass bloody or black tarry stools.  MAKE SURE YOU:   Understand these instructions.   Will watch your condition.   Will get help right away if you are not doing well or get worse.  Document Released: 08/18/2005 Document Revised: 10/28/2011 Document Reviewed: 06/26/2008 [facility] Patient Information 2012 [place], [place].Diet for GERD or PUD Nutrition therapy can help ease the discomfort of gastroesophageal reflux disease (GERD) and peptic ulcer disease (PUD).  HOME CARE INSTRUCTIONS   Eat your meals slowly, in a relaxed setting.   Eat 5 to 6 small meals per day.   If a food causes distress, stop eating it for a period of time.  FOODS TO AVOID  Coffee, regular or decaffeinated.     Cola beverages, regular or low calorie.   Tea, regular or decaffeinated.   Pepper.   Cocoa.   High fat foods, including meats.   Butter, margarine, hydrogenated oil (trans fats).   Peppermint or spearmint (if you have GERD).   Fruits and vegetables if not tolerated.   Alcohol.   Nicotine (smoking or chewing). This is one of the most potent stimulants to acid production in the gastrointestinal tract.   Any food that seems to aggravate your condition.  If you have questions regarding your diet, ask your caregiver or a registered dietitian. TIPS  Lying flat may make symptoms worse. Keep the head of your bed raised 6 to 9 inches (15 to 23 cm) by using a foam wedge or blocks under the legs of the bed.   Do not lay down until 3 hours after eating a meal.   Daily physical activity may help reduce symptoms.  MAKE SURE YOU:   Understand these instructions.   Will watch your condition.   Will get help right away if you are not doing well or get worse.  Document Released: 11/08/2005 Document Revised: 10/28/2011 Document Reviewed: 09/24/2011 [facility] Patient Information 2012 [place], [place].

## 2013-02-14 ENCOUNTER — Emergency Department (HOSPITAL_COMMUNITY)
Admission: EM | Admit: 2013-02-14 | Discharge: 2013-02-15 | Disposition: A | Discharge status: Home / Self Care | Payer: Self-pay | Attending: Emergency Medicine | Admitting: Emergency Medicine

## 2013-02-14 ENCOUNTER — Encounter (HOSPITAL_COMMUNITY): Payer: Self-pay | Admitting: Family Medicine

## 2013-02-14 ENCOUNTER — Emergency Department (HOSPITAL_COMMUNITY): Payer: Self-pay

## 2013-02-14 DIAGNOSIS — R112 Nausea with vomiting, unspecified: Secondary | ICD-10-CM | POA: Insufficient documentation

## 2013-02-14 DIAGNOSIS — R197 Diarrhea, unspecified: Secondary | ICD-10-CM | POA: Insufficient documentation

## 2013-02-14 DIAGNOSIS — R109 Unspecified abdominal pain: Secondary | ICD-10-CM | POA: Insufficient documentation

## 2013-02-14 DIAGNOSIS — Z87891 Personal history of nicotine dependence: Secondary | ICD-10-CM | POA: Insufficient documentation

## 2013-02-14 DIAGNOSIS — K0889 Other specified disorders of teeth and supporting structures: Secondary | ICD-10-CM

## 2013-02-14 DIAGNOSIS — K089 Disorder of teeth and supporting structures, unspecified: Secondary | ICD-10-CM | POA: Insufficient documentation

## 2013-02-14 MED ORDER — ONDANSETRON HCL 4 MG/2ML IJ SOLN
4.0000 mg | Freq: Once | INTRAMUSCULAR | Status: AC
  Administered 2013-02-15: 4 mg via INTRAVENOUS
  Filled 2013-02-14: qty 2

## 2013-02-14 MED ORDER — SODIUM CHLORIDE 0.9 % IV BOLUS (SEPSIS)
1000.0000 mL | Freq: Once | INTRAVENOUS | Status: AC
  Administered 2013-02-15: 1000 mL via INTRAVENOUS

## 2013-02-14 MED ORDER — IOHEXOL 300 MG/ML  SOLN
50.0000 mL | Freq: Once | INTRAMUSCULAR | Status: AC | PRN
  Administered 2013-02-14: 50 mL via ORAL

## 2013-02-14 NOTE — ED Notes (Signed)
Pt c/o n/v/d x 3 days. Pt also states that he has pain on the right side of his face. York Spaniel he has a cavity on that side that has not been filled.

## 2013-02-14 NOTE — ED Notes (Signed)
Patient states he has had abdominal pain, nausea, vomiting, diarrhea, right facial numbness and gum pain for the past 3 days. Also reports fever and chills.

## 2013-02-14 NOTE — ED Provider Notes (Signed)
History     CSN: [id number]  Arrival date & time 02/14/13  2255   First MD Initiated Contact with Patient 02/14/13 2316      Chief Complaint  Patient presents with  . Abdominal Pain  . Emesis  . Numbness    (Consider location/radiation/quality/duration/timing/severity/associated sxs/prior treatment) HPI Comments: Patient presents for a history of diffuse abdominal pain, nausea, vomiting and diarrhea. She's reports subjective fevers and chills at home. On with similar symptoms. Denies any antibiotic use recently. Also complains of pain in his right upper tooth where he has a known cavity. Denies numbness contrary to triage note. No headache, focal weakness, numbness or tingling. No previous abdominal surgeries. He has had a poor appetite for the past several days.  The history is provided by the patient.    History reviewed. No pertinent past medical history.  History reviewed. No pertinent past surgical history.  No family history on file.  History  Substance Use Topics  . Smoking status: Former Smoker    Types: Cigarettes  . Smokeless tobacco: Not on file  . Alcohol Use: Yes     Comment: Occassional      Review of Systems  Constitutional: Negative for fever, activity change and appetite change.  HENT: Negative for congestion and rhinorrhea.   Respiratory: Negative for cough, chest tightness and shortness of breath.   Cardiovascular: Negative for chest pain.  Gastrointestinal: Positive for nausea, vomiting, abdominal pain and diarrhea.  Genitourinary: Negative for dysuria, hematuria and testicular pain.  Musculoskeletal: Negative for back pain.  Skin: Negative for rash.  Neurological: Negative for dizziness, weakness and headaches.  A complete 10 system review of systems was obtained and all systems are negative except as noted in the HPI and PMH.    Allergies  Aspirin; Ibuprofen; Thorazine; and Tylenol  Home Medications   Current Outpatient Rx  Name  Route   Sig  Dispense  Refill  . ondansetron (ZOFRAN) 4 MG tablet   Oral   Take 1 tablet (4 mg total) by mouth every 6 (six) hours.   12 tablet   0   . oxyCODONE (ROXICODONE) 5 MG immediate release tablet   Oral   Take 1 tablet (5 mg total) by mouth every 4 (four) hours as needed for pain.   10 tablet   0   . penicillin v potassium (VEETID) 500 MG tablet   Oral   Take 1 tablet (500 mg total) by mouth 4 (four) times daily.   40 tablet   0     BP 130/77  Pulse 54  Temp(Src) 98.5 F (36.9 C) (Oral)  Resp 16  SpO2 100%  Physical Exam  Constitutional: He is oriented to person, place, and time. He appears well-developed and well-nourished. No distress.  HENT:  Head: Normocephalic and atraumatic.  Mouth/Throat: Oropharynx is clear and moist. No oropharyngeal exudate.  TTP R upper molar.  No abscess/ floor of mouth soft, no trismus  Eyes: Conjunctivae and EOM are normal. Pupils are equal, round, and reactive to light.  Neck: Normal range of motion. Neck supple.  Cardiovascular: Normal rate, regular rhythm and normal heart sounds.   No murmur heard. Pulmonary/Chest: Effort normal and breath sounds normal. No respiratory distress.  Abdominal: Soft. There is tenderness. There is no rebound and no guarding.  Diffuse abdominal pain.  No guarding or rebound.  Negative Murphy's sign. No pain at McBurney's point.  Musculoskeletal: Normal range of motion. He exhibits no edema and no tenderness.  Neurological: He  is alert and oriented to person, place, and time. No cranial nerve deficit. He exhibits normal muscle tone. Coordination normal.  Skin: Skin is warm.    ED Course  Procedures (including critical care time)  Labs Reviewed  CBC WITH DIFFERENTIAL - Abnormal; Notable for the following:    RBC 4.05 (*)    HCT 37.3 (*)    MCHC 36.5 (*)    Platelets 131 (*)    All other components within normal limits  COMPREHENSIVE METABOLIC PANEL - Abnormal; Notable for the following:    Sodium 132  (*)    Glucose, Bld 108 (*)    Albumin 3.1 (*)    All other components within normal limits  LIPASE, BLOOD  URINALYSIS, ROUTINE W REFLEX MICROSCOPIC  LACTIC ACID, PLASMA   Ct Abdomen Pelvis W Contrast  02/15/2013  *RADIOLOGY REPORT*  Clinical Data: Nausea, vomiting and diarrhea.  CT ABDOMEN AND PELVIS WITH CONTRAST  Technique:  Multidetector CT imaging of the abdomen and pelvis was performed following the standard protocol during bolus administration of intravenous contrast.  Contrast:  100 ml Omnipaque-300  Comparison: 03/25/2012.  Findings: The lung bases are clear.  The solid abdominal organs are normal.  The gallbladder is normal. No common bile duct dilatation.  The stomach, duodenum, small bowel and colon are unremarkable.  The appendix is normal.  No mesenteric or retroperitoneal mass or adenopathy.  The aorta is normal in caliber.  The major branch vessels are normal.  The bladder, prostate gland and seminal vesicles are unremarkable. No pelvic mass, adenopathy or free pelvic fluid collections.  No inguinal mass or hernia.  The bony pelvis is intact.  IMPRESSION:  Unremarkable CT abdomen/pelvis.  No acute abdominal/pelvic findings, mass lesions or adenopathy.   Original Report Authenticated By: Rudie Meyer, M.D.      1. Abdominal pain   2. Pain, dental       MDM  3 history of abdominal pain, nausea, vomiting and diarrhea. No fevers. Vital stable, no distress, abdomen soft and nonsurgical.  Labwork is unremarkable. Patient's abdominal pain has improved and he had no vomiting in the ED. CT scan is negative for acute pathology. He continues to complain of right upper dental pain. There is no evidence of abscess. Tolerating PO in the ED.  Return precautions discussed.      Glynn Octave, MD 02/15/13 [phone]

## 2013-02-15 ENCOUNTER — Emergency Department (HOSPITAL_COMMUNITY): Payer: Self-pay

## 2013-02-15 ENCOUNTER — Encounter (HOSPITAL_COMMUNITY): Payer: Self-pay

## 2013-02-15 LAB — URINALYSIS, ROUTINE W REFLEX MICROSCOPIC
Glucose, UA: NEGATIVE mg/dL
Hgb urine dipstick: NEGATIVE
Ketones, ur: NEGATIVE mg/dL
Leukocytes, UA: NEGATIVE
Protein, ur: NEGATIVE mg/dL

## 2013-02-15 LAB — CBC WITH DIFFERENTIAL/PLATELET
Basophils Absolute: 0 10*3/uL (ref 0.0–0.1)
HCT: 37.3 % — ABNORMAL LOW (ref 39.0–52.0)
Hemoglobin: 13.6 g/dL (ref 13.0–17.0)
Lymphocytes Relative: 35 % (ref 12–46)
Lymphs Abs: 1.7 10*3/uL (ref 0.7–4.0)
Monocytes Absolute: 0.5 10*3/uL (ref 0.1–1.0)
Monocytes Relative: 11 % (ref 3–12)
Neutro Abs: 2.6 10*3/uL (ref 1.7–7.7)
RBC: 4.05 MIL/uL — ABNORMAL LOW (ref 4.22–5.81)
WBC: 4.8 10*3/uL (ref 4.0–10.5)

## 2013-02-15 LAB — COMPREHENSIVE METABOLIC PANEL
AST: 16 U/L (ref 0–37)
CO2: 25 mEq/L (ref 19–32)
Chloride: 100 mEq/L (ref 96–112)
Creatinine, Ser: 0.95 mg/dL (ref 0.50–1.35)
GFR calc Af Amer: >90 mL/min (ref >90)
GFR calc non Af Amer: >90 mL/min (ref >90)
Glucose, Bld: 108 mg/dL — ABNORMAL HIGH (ref 70–99)
Total Bilirubin: 0.3 mg/dL (ref 0.3–1.2)

## 2013-02-15 MED ORDER — OXYCODONE HCL 5 MG PO TABS: 5.0000 mg | ORAL_TABLET | ORAL | Status: DC | PRN

## 2013-02-15 MED ORDER — IOHEXOL 300 MG/ML  SOLN
100.0000 mL | Freq: Once | INTRAMUSCULAR | Status: AC | PRN
  Administered 2013-02-15: 100 mL via INTRAVENOUS

## 2013-02-15 MED ORDER — OXYCODONE HCL 5 MG PO TABS
5.0000 mg | ORAL_TABLET | Freq: Once | ORAL | Status: AC
  Administered 2013-02-15: 5 mg via ORAL
  Filled 2013-02-15: qty 1

## 2013-02-15 MED ORDER — PENICILLIN V POTASSIUM 500 MG PO TABS: 500.0000 mg | ORAL_TABLET | Freq: Four times a day (QID) | ORAL | Status: AC

## 2013-02-15 MED ORDER — ONDANSETRON HCL 4 MG PO TABS: 4.0000 mg | ORAL_TABLET | Freq: Four times a day (QID) | ORAL | Status: DC

## 2013-02-15 NOTE — ED Notes (Signed)
Pt able to drink fluids with no n/v. 

## 2013-02-15 NOTE — ED Notes (Signed)
Patient transported to CT. Will obtain labs upon return

## 2013-08-08 ENCOUNTER — Emergency Department (HOSPITAL_COMMUNITY)
Admission: EM | Admit: 2013-08-08 | Discharge: 2013-08-08 | Disposition: A | Discharge status: Home / Self Care | Payer: BC Managed Care – PPO | Attending: Emergency Medicine | Admitting: Emergency Medicine

## 2013-08-08 ENCOUNTER — Other Ambulatory Visit: Payer: Self-pay

## 2013-08-08 ENCOUNTER — Encounter (HOSPITAL_COMMUNITY): Payer: Self-pay | Admitting: *Deleted

## 2013-08-08 ENCOUNTER — Emergency Department (HOSPITAL_COMMUNITY): Payer: BC Managed Care – PPO

## 2013-08-08 DIAGNOSIS — F17200 Nicotine dependence, unspecified, uncomplicated: Secondary | ICD-10-CM | POA: Insufficient documentation

## 2013-08-08 DIAGNOSIS — K0889 Other specified disorders of teeth and supporting structures: Secondary | ICD-10-CM

## 2013-08-08 DIAGNOSIS — K089 Disorder of teeth and supporting structures, unspecified: Secondary | ICD-10-CM | POA: Insufficient documentation

## 2013-08-08 MED ORDER — OXYCODONE HCL 5 MG PO TABS
5.0000 mg | ORAL_TABLET | Freq: Once | ORAL | Status: AC
  Administered 2013-08-08: 5 mg via ORAL
  Filled 2013-08-08: qty 1

## 2013-08-08 MED ORDER — OXYCODONE HCL 5 MG PO TABS: 5.0000 mg | ORAL_TABLET | Freq: Four times a day (QID) | ORAL | Status: DC | PRN

## 2013-08-08 NOTE — ED Provider Notes (Signed)
CSN: [id number]     Arrival date & time 08/08/13  0210 History   First MD Initiated Contact with Patient 08/08/13 [phone]     Chief Complaint  Patient presents with  . Facial Swelling   (Consider location/radiation/quality/duration/timing/severity/associated sxs/prior Treatment) HPI Comments: Agencies he's had right upper tooth and gum pain for the past several, days.  He, states this tooth was supposedly told.  The last time.  It was a free dental clinic in town, but he feels that as if there is still a piece of tooth in the gum.  He has not taken any over-the-counter medication.  For pain.  Do, to his multiple allergies to aspirin, ibuprofen, and Tylenol  The history is provided by the patient.    History reviewed. No pertinent past medical history. History reviewed. No pertinent past surgical history. No family history on file. History  Substance Use Topics  . Smoking status: Current Some Day Smoker    Types: Cigarettes  . Smokeless tobacco: Not on file  . Alcohol Use: Yes     Comment: Occassional    Review of Systems  Constitutional: Negative for fever and chills.  HENT: Positive for dental problem. Negative for neck pain.   All other systems reviewed and are negative.    Allergies  Pork-derived products; Aspirin; Ibuprofen; Thorazine; and Tylenol  Home Medications   Current Outpatient Rx  Name  Route  Sig  Dispense  Refill  . oxyCODONE (OXY IR/ROXICODONE) 5 MG immediate release tablet   Oral   Take 1 tablet (5 mg total) by mouth every 6 (six) hours as needed for pain.   17 tablet   0    BP 139/84  Pulse 48  Temp(Src) 98 F (36.7 C) (Oral)  SpO2 100% Physical Exam  Nursing note and vitals reviewed. Constitutional: He is oriented to person, place, and time. He appears well-developed and well-nourished.  HENT:  Head: Normocephalic.  Mouth/Throat:    Eyes: Pupils are equal, round, and reactive to light.  Neck: Normal range of motion.  Cardiovascular: Normal  rate.   Patient's heart rate in the 50s on examination of his chart, he has had sinus bradycardia.  Multiple times throughout the past year on ED visits  Pulmonary/Chest: Effort normal.  Musculoskeletal: Normal range of motion.  Neurological: He is alert and oriented to person, place, and time.  Skin: Skin is warm and dry.    ED Course  Procedures (including critical care time) Labs Review Labs Reviewed - No data to display Imaging Review Dg Orthopantogram  08/08/2013   CLINICAL DATA:  Right upper molar broken with abscess.  EXAM: ORTHOPANTOGRAM/PANORAMIC  COMPARISON:  None.  FINDINGS: No clear periapical erosion associated with remaining right upper molar. A right upper premolar root is obscured by artifactual blurring. Numerous dental fillings. There is evidence of periapical erosion around the right more than left remaining lower molars. No osseous fracture. Located temporomandibular joints.  IMPRESSION: No definite erosion/periapical abscess involving the symptomatic right upper molar.   Electronically Signed   By: Tiburcio Pea   On: 08/08/2013 03:44   ED ECG REPORT   Date: 08/08/2013  EKG Time: 4:08 AM  Rate: 48  Rhythm: sinus bradycardia,  unchanged from previous tracings  Axis: normal   Intervals:none  ST&T Change: normal  Narrative Interpretation: Sinus bradycardia           MDM   1. Pain, dental    No evidence of abscess on Panorex film.  Patient has been  discharged home with prescription for oxycodone, and a referral to the dental clinic, but will be held.  September 26 and 27     Arman Filter, NP 08/08/13 0408  Arman Filter, NP 08/08/13 [phone]

## 2013-08-08 NOTE — ED Notes (Signed)
Pt pulse rate in the 40s, EKG completed, pt reports no hx of low pulse rate

## 2013-08-08 NOTE — ED Notes (Signed)
Pt c/o facial swelling x several days; pt unsure if it is related to sinus problem or a bad tooth

## 2013-08-08 NOTE — ED Provider Notes (Signed)
Medical screening examination/treatment/procedure(s) were performed by non-physician practitioner and as supervising physician I was immediately available for consultation/collaboration.   William Brien Lowe, MD 08/08/13 0726 

## 2013-08-09 ENCOUNTER — Encounter (HOSPITAL_COMMUNITY): Payer: Self-pay | Admitting: Emergency Medicine

## 2013-08-09 ENCOUNTER — Emergency Department (HOSPITAL_COMMUNITY)
Admission: EM | Admit: 2013-08-09 | Discharge: 2013-08-09 | Disposition: A | Discharge status: Home / Self Care | Payer: BC Managed Care – PPO | Attending: Emergency Medicine | Admitting: Emergency Medicine

## 2013-08-09 DIAGNOSIS — F17200 Nicotine dependence, unspecified, uncomplicated: Secondary | ICD-10-CM | POA: Insufficient documentation

## 2013-08-09 DIAGNOSIS — M542 Cervicalgia: Secondary | ICD-10-CM | POA: Insufficient documentation

## 2013-08-09 DIAGNOSIS — K047 Periapical abscess without sinus: Secondary | ICD-10-CM | POA: Insufficient documentation

## 2013-08-09 MED ORDER — OXYCODONE HCL 5 MG PO TABS: 5.0000 mg | ORAL_TABLET | Freq: Four times a day (QID) | ORAL | Status: DC | PRN

## 2013-08-09 MED ORDER — PENICILLIN V POTASSIUM 500 MG PO TABS: 500.0000 mg | ORAL_TABLET | Freq: Three times a day (TID) | ORAL | Status: DC

## 2013-08-09 MED ORDER — BUPIVACAINE-EPINEPHRINE PF 0.5-1:200000 % IJ SOLN
1.8000 mL | Freq: Once | INTRAMUSCULAR | Status: AC
  Administered 2013-08-09: 9 mg
  Filled 2013-08-09: qty 1.8

## 2013-08-09 MED ORDER — OXYCODONE HCL 5 MG PO TABS
5.0000 mg | ORAL_TABLET | Freq: Once | ORAL | Status: AC
  Administered 2013-08-09: 5 mg via ORAL
  Filled 2013-08-09: qty 1

## 2013-08-09 MED ORDER — PENICILLIN V POTASSIUM 500 MG PO TABS
500.0000 mg | ORAL_TABLET | Freq: Once | ORAL | Status: AC
  Administered 2013-08-09: 500 mg via ORAL
  Filled 2013-08-09: qty 1

## 2013-08-09 MED ORDER — LIDOCAINE HCL 2 % IJ SOLN
20.0000 mL | Freq: Once | INTRAMUSCULAR | Status: AC
  Administered 2013-08-09: 40 mg via INTRADERMAL
  Filled 2013-08-09: qty 20

## 2013-08-09 NOTE — ED Notes (Signed)
Pt seen yesterday for facial pain and swelling. The dental pain and facial swelling on l/side of face increased over last 24 hrs

## 2013-08-09 NOTE — ED Provider Notes (Signed)
CSN: [id number]     Arrival date & time 08/09/13  1602 History  This chart was scribed for non-physician practitioner Renne Crigler, PA-C working with Roney Marion, MD by Danella Maiers, ED Scribe. This patient was seen in room WTR5/WTR5 and the patient's care was started at 4:15 PM.    Chief Complaint  Patient presents with  . Dental Pain  . Facial Swelling    increased swelling and pain over last 24 hrs   The history is provided by the patient. No language interpreter was used.   HPI Comments: Roy Barry is a 42 y.o. male who presents to the Emergency Department complaining of constant left-sided dental pain with associated facial swelling onset 2 days ago. Pt was seen yesterday for the same symptoms and given oxycodone. He states the pain has worsened since yesterday. He also complains of mild neck pain. No neck swelling or difficulty breathing/swallowing. He denies fever. He has no other medical problems.   History reviewed. No pertinent past medical history. History reviewed. No pertinent past surgical history. History reviewed. No pertinent family history. History  Substance Use Topics  . Smoking status: Current Some Day Smoker    Types: Cigarettes  . Smokeless tobacco: Not on file  . Alcohol Use: Yes     Comment: Occassional    Review of Systems  Constitutional: Negative for fever.  HENT: Positive for facial swelling, neck pain and dental problem. Negative for ear pain, sore throat and trouble swallowing.   Respiratory: Negative for shortness of breath and stridor.   Skin: Negative for color change.  Neurological: Negative for headaches.    Allergies  Pork-derived products; Aspirin; Ibuprofen; Thorazine; and Tylenol  Home Medications   Current Outpatient Rx  Name  Route  Sig  Dispense  Refill  . oxyCODONE (OXY IR/ROXICODONE) 5 MG immediate release tablet   Oral   Take 1 tablet (5 mg total) by mouth every 6 (six) hours as needed for pain.   17 tablet    0    BP 108/49  Pulse 86  Temp(Src) 98.8 F (37.1 C) (Oral)  Resp 20  SpO2 95% Physical Exam  Nursing note and vitals reviewed. Constitutional: He appears well-developed and well-nourished.  HENT:  Head: Normocephalic and atraumatic.  Right Ear: Tympanic membrane, external ear and ear canal normal.  Left Ear: Tympanic membrane, external ear and ear canal normal.  Nose: Nose normal.  Mouth/Throat: Uvula is midline, oropharynx is clear and moist and mucous membranes are normal. No trismus in the jaw. Abnormal dentition. Dental caries present. No dental abscesses or edematous. No tonsillar abscesses.  Patient with R maxillary tooth pain and tenderness to palpation in area of R maxillary premolars. Advanced periodontal disease. Palpable abscess right upper cheek with gross swelling.   Eyes: Pupils are equal, round, and reactive to light.  Neck: Normal range of motion. Neck supple.  No neck swelling or Lugwig's angina  Neurological: He is alert.  Skin: Skin is warm and dry.  Psychiatric: He has a normal mood and affect.    ED Course  Procedures (including critical care time) Medications - No data to display  DIAGNOSTIC STUDIES: Oxygen Saturation is 95% on room air, normal by my interpretation.    COORDINATION OF CARE: 4:44 PM- Discussed treatment plan with pt which includes treatment with antibiotics and I&D. Pt agrees to plan.    Labs Review Labs Reviewed - No data to display Imaging Review Dg Orthopantogram  08/08/2013   CLINICAL DATA:  Right upper molar broken with abscess.  EXAM: ORTHOPANTOGRAM/PANORAMIC  COMPARISON:  None.  FINDINGS: No clear periapical erosion associated with remaining right upper molar. A right upper premolar root is obscured by artifactual blurring. Numerous dental fillings. There is evidence of periapical erosion around the right more than left remaining lower molars. No osseous fracture. Located temporomandibular joints.  IMPRESSION: No definite  erosion/periapical abscess involving the symptomatic right upper molar.   Electronically Signed   By: Tiburcio Pea   On: 08/08/2013 03:44   5:33 PM Patient seen and examined.   Vital signs reviewed and are as follows: Filed Vitals:   08/09/13 1616  BP: 108/49  Pulse: 86  Temp: 98.8 F (37.1 C)  Resp: 20   5:33 PM Dental block was performed. 1.72mL of 1% lidocaine with epi was combined with 1.17mL 0.5% bupivacaine with epi and an alveolar block was performed. Injections made at base of tooth as well as the tooth immediately posterior and anterior to tooth. Adequate anesthesia was obtained. Minimal bleeding after injections. Patient tolerated procedure well with no immediate complications.   Needle aspiration attempted without return of pus.   Penicillin given.   Patient counseled to take prescribed medications as directed, return with worsening facial or neck swelling, and to follow-up with their dentist as soon as possible.      MDM   1. Dental abscess    Patient dental abscess.  Exam unconcerning for Ludwig's angina or other deep tissue infection in neck.  Will treat with penicillin and pain medicine.  Urged patient to follow-up with dentist.         I personally performed the services described in this documentation, which was scribed in my presence. The recorded information has been reviewed and is accurate.    Renne Crigler, PA-C 08/09/13 1734

## 2013-08-09 NOTE — ED Provider Notes (Signed)
Medical screening examination/treatment/procedure(s) were performed by non-physician practitioner and as supervising physician I was immediately available for consultation/collaboration.   Roney Marion, MD 08/09/13 [phone]

## 2014-08-07 ENCOUNTER — Emergency Department (HOSPITAL_COMMUNITY): Payer: Worker's Compensation

## 2014-08-07 ENCOUNTER — Emergency Department (HOSPITAL_COMMUNITY)
Admission: EM | Admit: 2014-08-07 | Discharge: 2014-08-07 | Disposition: A | Discharge status: Home / Self Care | Payer: Worker's Compensation | Attending: Emergency Medicine | Admitting: Emergency Medicine

## 2014-08-07 ENCOUNTER — Encounter (HOSPITAL_COMMUNITY): Payer: Self-pay | Admitting: Emergency Medicine

## 2014-08-07 DIAGNOSIS — S3992XA Unspecified injury of lower back, initial encounter: Secondary | ICD-10-CM

## 2014-08-07 DIAGNOSIS — IMO0002 Reserved for concepts with insufficient information to code with codable children: Secondary | ICD-10-CM | POA: Diagnosis not present

## 2014-08-07 DIAGNOSIS — W11XXXA Fall on and from ladder, initial encounter: Secondary | ICD-10-CM | POA: Insufficient documentation

## 2014-08-07 DIAGNOSIS — S0990XA Unspecified injury of head, initial encounter: Secondary | ICD-10-CM

## 2014-08-07 DIAGNOSIS — W1809XA Striking against other object with subsequent fall, initial encounter: Secondary | ICD-10-CM | POA: Insufficient documentation

## 2014-08-07 DIAGNOSIS — Y9289 Other specified places as the place of occurrence of the external cause: Secondary | ICD-10-CM | POA: Diagnosis not present

## 2014-08-07 DIAGNOSIS — F17200 Nicotine dependence, unspecified, uncomplicated: Secondary | ICD-10-CM | POA: Insufficient documentation

## 2014-08-07 DIAGNOSIS — Y9389 Activity, other specified: Secondary | ICD-10-CM | POA: Insufficient documentation

## 2014-08-07 LAB — CBC
HCT: 36.8 % — ABNORMAL LOW (ref 39.0–52.0)
Hemoglobin: 12.9 g/dL — ABNORMAL LOW (ref 13.0–17.0)
MCH: 32.6 pg (ref 26.0–34.0)
MCHC: 35.1 g/dL (ref 30.0–36.0)
MCV: 92.9 fL (ref 78.0–100.0)
PLATELETS: 172 10*3/uL (ref 150–400)
RBC: 3.96 MIL/uL — AB (ref 4.22–5.81)
RDW: 12.5 % (ref 11.5–15.5)
WBC: 4.3 10*3/uL (ref 4.0–10.5)

## 2014-08-07 LAB — BASIC METABOLIC PANEL
ANION GAP: 10 (ref 5–15)
BUN: 12 mg/dL (ref 6–23)
CHLORIDE: 106 meq/L (ref 96–112)
CO2: 26 meq/L (ref 19–32)
CREATININE: 0.92 mg/dL (ref 0.50–1.35)
Calcium: 9.8 mg/dL (ref 8.4–10.5)
GFR calc non Af Amer: >90 mL/min (ref >90)
Glucose, Bld: 92 mg/dL (ref 70–99)
POTASSIUM: 4.1 meq/L (ref 3.7–5.3)
SODIUM: 142 meq/L (ref 137–147)

## 2014-08-07 LAB — I-STAT TROPONIN, ED: Troponin i, poc: 0 ng/mL (ref 0.00–0.08)

## 2014-08-07 LAB — RAPID URINE DRUG SCREEN, HOSP PERFORMED
AMPHETAMINES: NOT DETECTED
BARBITURATES: NOT DETECTED
BENZODIAZEPINES: NOT DETECTED
Cocaine: NOT DETECTED
Opiates: NOT DETECTED
TETRAHYDROCANNABINOL: POSITIVE — AB

## 2014-08-07 MED ORDER — OXYCODONE HCL 5 MG PO TABS: 5.0000 mg | ORAL_TABLET | Freq: Four times a day (QID) | ORAL | Status: DC | PRN

## 2014-08-07 MED ORDER — OXYCODONE-ACETAMINOPHEN 5-325 MG PO TABS
2.0000 | ORAL_TABLET | Freq: Once | ORAL | Status: AC
  Administered 2014-08-07: 2 via ORAL
  Filled 2014-08-07: qty 2

## 2014-08-07 MED ORDER — DIAZEPAM 5 MG PO TABS: 5.0000 mg | ORAL_TABLET | Freq: Four times a day (QID) | ORAL | Status: DC | PRN

## 2014-08-07 MED ORDER — DIAZEPAM 5 MG PO TABS
5.0000 mg | ORAL_TABLET | Freq: Once | ORAL | Status: AC
  Administered 2014-08-07: 5 mg via ORAL
  Filled 2014-08-07: qty 1

## 2014-08-07 NOTE — ED Notes (Signed)
Pt was coming off a step ladder and he felt dizzy, the ladder shifted and he hit his head on the concrete about 2am.

## 2014-08-07 NOTE — Discharge Instructions (Signed)
Take oxycodone as needed for pain. DO NOT drive with it.   Take valium for muscle spasms.   Off work for 3 days.   Follow up with your doctor.   Soft collar for comfort.   Return to ED if you have severe pain, headaches, vomiting.

## 2014-08-07 NOTE — ED Provider Notes (Signed)
CSN: [id number]     Arrival date & time 08/07/14  4010 History   First MD Initiated Contact with Patient 08/07/14 [phone]     Chief Complaint  Patient presents with  . Fall     (Consider location/radiation/quality/duration/timing/severity/associated sxs/prior Treatment) The history is provided by the patient.  Roy Barry is a 43 y.o. male here with fall. He was on top of a 4 foot ladder and was trying to come down when he felt light headed and dizzy and fell and hit his head. Has back pain and headache afterwards. Denies passing out.    History reviewed. No pertinent past medical history. History reviewed. No pertinent past surgical history. History reviewed. No pertinent family history. History  Substance Use Topics  . Smoking status: Current Some Day Smoker    Types: Cigarettes  . Smokeless tobacco: Not on file  . Alcohol Use: Yes     Comment: Occassional    Review of Systems  Musculoskeletal: Positive for back pain.  Neurological: Positive for headaches.  All other systems reviewed and are negative.     Allergies  Pork-derived products; Aspirin; Ibuprofen; Thorazine; and Tylenol  Home Medications   Prior to Admission medications   Not on File   BP 121/66  Pulse 54  Temp(Src) 97.8 F (36.6 C) (Oral)  Resp 14  SpO2 99% Physical Exam  Nursing note and vitals reviewed. Constitutional: He is oriented to person, place, and time.  Uncomfortable   HENT:  Head: Normocephalic.  Eyes: Conjunctivae are normal. Pupils are equal, round, and reactive to light.  Neck:  C collar in place   Cardiovascular: Normal rate, regular rhythm and normal heart sounds.   Pulmonary/Chest: Effort normal and breath sounds normal. No respiratory distress. He has no wheezes. He has no rales.  Abdominal: Soft. Bowel sounds are normal. He exhibits no distension and no mass. There is no tenderness. There is no rebound and no guarding.  Musculoskeletal:  Diffuse spinal tenderness,  mostly para spinal areas. No obvious step off   Neurological: He is alert and oriented to person, place, and time.  Skin: Skin is warm and dry.  Psychiatric: He has a normal mood and affect. His behavior is normal. Judgment and thought content normal.    ED Course  Procedures (including critical care time) Labs Review Labs Reviewed  CBC - Abnormal; Notable for the following:    RBC 3.96 (*)    Hemoglobin 12.9 (*)    HCT 36.8 (*)    All other components within normal limits  BASIC METABOLIC PANEL  Rosezena Sensor, ED    Imaging Review Dg Thoracic Spine 2 View  08/07/2014   CLINICAL DATA:  Fall from ladder, mid back pain.  EXAM: THORACIC SPINE - 2 VIEW  COMPARISON:  None.  FINDINGS: There is no evidence of thoracic spine fracture. Mild S-type scoliosis on this nonweightbearing examination. Alignment is normal. No other significant bone abnormalities are identified.  IMPRESSION: No acute fracture deformity or malalignment.   Electronically Signed   By: Awilda Metro   On: 08/07/2014 05:50   Dg Lumbar Spine Complete  08/07/2014   CLINICAL DATA:  Fall from ladder, back pain.  EXAM: LUMBAR SPINE - COMPLETE 4+ VIEW  COMPARISON:  CT of the abdomen pelvis February 15, 2013  FINDINGS: Five non rib-bearing lumbar-type vertebral bodies are intact and aligned with maintenance of the lumbar lordosis. Intervertebral disc heights are normal. No pars interarticularis defects. No destructive bony lesions.  Sacroiliac joints are  symmetric. Included prevertebral and paraspinal soft tissue planes are non-suspicious. Phleboliths in pelvis.  IMPRESSION: Negative.   Electronically Signed   By: Awilda Metro   On: 08/07/2014 05:51   Ct Head Wo Contrast  08/07/2014   CLINICAL DATA:  Larey Seat from step bladder, hit head on concrete. No loss of consciousness.  EXAM: CT HEAD WITHOUT CONTRAST  CT CERVICAL SPINE WITHOUT CONTRAST  TECHNIQUE: Multidetector CT imaging of the head and cervical spine was performed  following the standard protocol without intravenous contrast. Multiplanar CT image reconstructions of the cervical spine were also generated.  COMPARISON:  CT of the head November 03, 2011 and cervical spine radiograph November 03, 2011  FINDINGS: CT HEAD FINDINGS  The ventricles and sulci are normal. No intraparenchymal hemorrhage, mass effect nor midline shift. No acute large vascular territory infarcts.  No abnormal extra-axial fluid collections. Basal cisterns are patent.  No skull fracture. The included ocular globes and orbital contents are non-suspicious. The mastoid aircells and included paranasal sinuses are well-aerated. Minimal soft tissue within the external auditory canals likely reflects cerumen. Mild LEFT temporomandibular osteoarthrosis.  CT CERVICAL SPINE FINDINGS  Cervical vertebral bodies and posterior elements are intact and aligned with maintenance of the cervical lordosis. Intervertebral disc heights preserved. No destructive bony lesions. C1-2 articulation maintained. Included prevertebral and paraspinal soft tissues are non acute; mild calcific atherosclerosis of the RIGHT carotid bulb.  IMPRESSION: CT head: No acute intracranial process ; normal noncontrast CT of the head, stable from 2012.  CT cervical spine:  No acute fracture nor malalignment.   Electronically Signed   By: Awilda Metro   On: 08/07/2014 04:51   Ct Cervical Spine Wo Contrast  08/07/2014   CLINICAL DATA:  Larey Seat from step bladder, hit head on concrete. No loss of consciousness.  EXAM: CT HEAD WITHOUT CONTRAST  CT CERVICAL SPINE WITHOUT CONTRAST  TECHNIQUE: Multidetector CT imaging of the head and cervical spine was performed following the standard protocol without intravenous contrast. Multiplanar CT image reconstructions of the cervical spine were also generated.  COMPARISON:  CT of the head November 03, 2011 and cervical spine radiograph November 03, 2011  FINDINGS: CT HEAD FINDINGS  The ventricles and sulci are  normal. No intraparenchymal hemorrhage, mass effect nor midline shift. No acute large vascular territory infarcts.  No abnormal extra-axial fluid collections. Basal cisterns are patent.  No skull fracture. The included ocular globes and orbital contents are non-suspicious. The mastoid aircells and included paranasal sinuses are well-aerated. Minimal soft tissue within the external auditory canals likely reflects cerumen. Mild LEFT temporomandibular osteoarthrosis.  CT CERVICAL SPINE FINDINGS  Cervical vertebral bodies and posterior elements are intact and aligned with maintenance of the cervical lordosis. Intervertebral disc heights preserved. No destructive bony lesions. C1-2 articulation maintained. Included prevertebral and paraspinal soft tissues are non acute; mild calcific atherosclerosis of the RIGHT carotid bulb.  IMPRESSION: CT head: No acute intracranial process ; normal noncontrast CT of the head, stable from 2012.  CT cervical spine:  No acute fracture nor malalignment.   Electronically Signed   By: Awilda Metro   On: 08/07/2014 04:51     EKG Interpretation   Date/Time:  Wednesday August 07 2014 04:35:56 EDT Ventricular Rate:  55 PR Interval:  139 QRS Duration: 96 QT Interval:  422 QTC Calculation: 404 R Axis:   87 Text Interpretation:  Sinus rhythm ST elev, probable normal early repol  pattern No significant change since last tracing Confirmed by Silverio Lay  MD,  DAVID ([id number]) on 08/07/2014 4:50:14 AM      MDM   Final diagnoses:  None   BRODAN GREWELL is a 43 y.o. male here with fall. Will do CT head/neck, xrays. Will give pain meds.   6:05 AM CT head/neck, xrays unremarkable. Felt better after pain meds. Will d/c home. He request soft collar for comfort.    Richardean Canal, MD 08/07/14 [phone]

## 2014-08-07 NOTE — ED Notes (Signed)
Bed: UE45 Expected date: 08/07/14 Expected time: 2:58 AM Means of arrival: Ambulance Comments: Fall, lsb, head pain

## 2014-08-12 ENCOUNTER — Emergency Department (HOSPITAL_COMMUNITY)
Admission: EM | Admit: 2014-08-12 | Discharge: 2014-08-12 | Disposition: A | Discharge status: Home / Self Care | Payer: Worker's Compensation | Attending: Emergency Medicine | Admitting: Emergency Medicine

## 2014-08-12 ENCOUNTER — Encounter (HOSPITAL_COMMUNITY): Payer: Self-pay | Admitting: Emergency Medicine

## 2014-08-12 DIAGNOSIS — M62838 Other muscle spasm: Secondary | ICD-10-CM | POA: Diagnosis not present

## 2014-08-12 DIAGNOSIS — M25519 Pain in unspecified shoulder: Secondary | ICD-10-CM | POA: Insufficient documentation

## 2014-08-12 DIAGNOSIS — F17200 Nicotine dependence, unspecified, uncomplicated: Secondary | ICD-10-CM | POA: Diagnosis not present

## 2014-08-12 DIAGNOSIS — M542 Cervicalgia: Secondary | ICD-10-CM | POA: Insufficient documentation

## 2014-08-12 DIAGNOSIS — G8911 Acute pain due to trauma: Secondary | ICD-10-CM | POA: Insufficient documentation

## 2014-08-12 DIAGNOSIS — M62830 Muscle spasm of back: Secondary | ICD-10-CM

## 2014-08-12 MED ORDER — METHOCARBAMOL 500 MG PO TABS
1000.0000 mg | ORAL_TABLET | Freq: Once | ORAL | Status: AC
  Administered 2014-08-12: 1000 mg via ORAL
  Filled 2014-08-12: qty 2

## 2014-08-12 MED ORDER — METHOCARBAMOL 500 MG PO TABS: 1000.0000 mg | ORAL_TABLET | Freq: Four times a day (QID) | ORAL | Status: DC | PRN

## 2014-08-12 NOTE — ED Notes (Signed)
PT here with neck pain and right shoulder pain x 1 week dt injury last week at work. Seen at [facility] for same. Pt states he has run out of pain medications. Pt has C Collar in place from home, states "it just makes it feel better." Pt denies any new injuries. AO x4.

## 2014-08-12 NOTE — Discharge Instructions (Signed)
You can also take  tylenol (acetaminophen)  (this is 3 over the counter pills) four times a day. Do not drink alcohol or combine with other medications that have acetaminophen as an ingredient (Read the labels!).    For breakthrough pain you may take Robaxin. Do not drink alcohol, drive or operate heavy machinery when taking Robaxin.  Do not hesitate to return to the emergency room for any new, worsening or concerning symptoms.  Please obtain primary care using resource guide below. But the minute you were seen in the emergency room and that they will need to obtain records for further outpatient management.     Emergency Department Resource Guide 1) Find a Doctor and Pay Out of Pocket Although you won't have to find out who is covered by your insurance plan, it is a good idea to ask around and get recommendations. You will then need to call the office and see if the doctor you have chosen will accept you as a new patient and what types of options they offer for patients who are self-pay. Some doctors offer discounts or will set up payment plans for their patients who do not have insurance, but you will need to ask so you aren't surprised when you get to your appointment.  2) Contact Your Local Health Department Not all health departments have doctors that can see patients for sick visits, but many do, so it is worth a call to see if yours does. If you don't know where your local health department is, you can check in your phone book. The CDC also has a tool to help you locate your state's health department, and many state websites also have listings of all of their local health departments.  3) Find a Walk-in Clinic If your illness is not likely to be very severe or complicated, you may want to try a walk in clinic. These are popping up all over the country in pharmacies, drugstores, and shopping centers. They're usually staffed by nurse practitioners or physician assistants that have been  trained to treat common illnesses and complaints. They're usually fairly quick and inexpensive. However, if you have serious medical issues or chronic medical problems, these are probably not your best option.  No Primary Care Doctor: - Call Health Connect at  [phone] - they can help you locate a primary care doctor that  accepts your insurance, provides certain services, etc. - Physician Referral Service- [phone]  Chronic Pain Problems: Organization         Address  Phone   Notes  [facility]  [phone] Patients need to be referred by their primary care doctor.   Medication Assistance: Organization         Address  Phone   Notes  [facility] Medication [facility] [address] [place] [place], [place] [address] [phone] --Must be a resident of [facility] -- Must have NO insurance coverage whatsoever (no Medicaid/ Medicare, etc.) -- The pt. MUST have a primary care doctor that directs their care regularly and follows them in the community   MedAssist  [phone]   [employer]  [phone]    Agencies that provide inexpensive medical care: Organization         Address  Phone   Notes  [facility]  [phone]   [facility]    [phone]   [facility] [address] [place], [place] [address] [phone]  [place] [address], [place] [phone]   Planned Parenthood    [phone]   [facility]    [phone]   [place] and [place] [address], [place] Phone:  [phone], Fax:  [phone] Hours of Operation:  9 am - 6 pm, M-F.  Also accepts Medicaid/Medicare and self-pay.  [facility] for [place], [address], [place] Phone: [phone], Fax: [phone]. Hours of Operation:  8:30 am - 5:30 pm, M-F.  Also accepts Medicaid and self-pay.   [facility] [place] [address], [place] Phone: [phone]   [place], [place], [place] [phone], Ext. [phone] Mondays & Thursdays: 7-9 AM.  First 15 patients are seen on a first come, first serve basis.    [place] Providers:  Organization         Address  Phone   Notes  [facility] [address], [place] [phone] Also accepts self-pay patients.  [facility] [address], [place]  [phone]   [place], [address], [place] [phone]   [facility] [address] [place] [phone]   Lucianne Lei [address], [place]   [phone] Only accepts [place] Access [place] patients after they have their name applied to their card.   Self-Pay (no insurance) in [facility]:  Organization         Address  Phone   Notes  Sickle Cell Patients, [facility] Internal Medicine [place] [phone]   [facility] [place] [phone]   [facility] [place]  [place], [place], [place] [phone]   [facility]/Dr. Osei-Bonsu  [address] [place] or [address], [place] [phone] Phone number for both [place] and [place] locations is the same.  [facility] [address], [place] [phone]   [facility] [address], [place] or [address] [phone]   [facility] [address] [place] [phone], phone; [phone], fax Sees patients 1st and 3rd Saturday of every month.  Must not qualify for public or private insurance (i.e. Medicaid, Medicare, [place] Health Choice, Veterans' Benefits)  Household income should be no  more than 200% of the poverty level The clinic cannot treat you if you are pregnant or think you are pregnant  Sexually transmitted diseases are not treated at the clinic.    Dental Care: Organization         Address  Phone  Notes  [facility] Department of [facility] [place] [phone] Accepts children up to age 11 who are enrolled in [place] or [place]; pregnant women with a Medicaid card; and children who have applied for Medicaid or [place] Health Choice, but were declined, whose parents can pay a reduced fee at time of service.  [facility] Department of [facility]  [address], [place] [phone] Accepts children up to age 34 who are enrolled in [place] or [place]; pregnant women with a  Medicaid card; and children who have applied for Medicaid or [place] Health Choice, but were declined, whose parents can pay a reduced fee at time of service.  [place] [facility] PRO[facility]  [place] [phone] Patients are seen by appointment only. Walk-ins are not accepted. [place] will see patients 20 years of age and older. Monday - Tuesday (8am-5pm) Most Wednesdays (8:30-5pm) $30 per visit, cash only  [facility] Adult Dental Access PROGRAM  [address], [facility] [phone] Patients are seen by appointment only. Walk-ins are not accepted. [place] will see patients 8 years of age and older. One Wednesday Evening (Monthly: Volunteer Based).  $30 per visit, cash only  [place]  [phone] for adults; Children under age 20, call Graduate Pediatric Dentistry at [phone]. Children aged 70-14, please call [phone] to request a pediatric application.  Dental services are provided in all areas of dental care including fillings, crowns and bridges, complete and partial dentures, implants, gum treatment, root canals,  and extractions. Preventive care is also provided. Treatment is provided to both adults and children. Patients are selected via a lottery and there is often a waiting list.   [facility] [address] [place]  [phone] [email or web address]   [facility] [address] [place], [place] [phone], Ext. [phone] Second and Fourth Thursday of each month, opens at 6:30 AM; Clinic ends at 9 AM.  Patients are seen on a first-come first-served basis, and a limited number are seen during each clinic.   [facility]  [address] Danker [place], [place] [phone]   Eligibility Requirements You must have lived in [place], [place], or [place] counties for at least the last three months.   You cannot be eligible for state or federal sponsored [employer], including [employer], [place], or [employer].   You generally cannot be eligible for healthcare insurance through your employer.    How to apply: Eligibility screenings are held every Tuesday and Wednesday afternoon from 1:00 pm until 4:00 pm. You do not need an appointment for the interview!  [facility] [address] [place], [place]   [place]  [place] Department  [place]  [phone]    Behavioral Health Resources in the Community: Intensive Outpatient Programs Organization         Address  Phone  Notes  [place]. [address] [place], [place] [phone]   [facility] [address], [place], [place]   [facility] [address] [place], [place]   [place] [address]  [place], [place] or [phone]   Substance Abuse Resources Organization         Address  Phone  Notes  [facility] Drug Services  [phone]    [place]  [phone]   [facility] [place]   Chinita Pester  [phone]   Residential & Outpatient Substance Abuse Program  [phone]   Psychological Services Organization         Address  Phone  Notes  [facility] [place]  [place]  [phone]   [place] [address], [place] or [phone]    Mobile Crisis Teams Organization  Address  Phone  Notes  [facility], Mobile Crisis Care Unit  [phone]   [facility]  [address] [place], [place] [phone]   [facility] [address] [place] [phone]    Self-Help/Support Groups Organization         Address  Phone             Notes  [facility] [place] - variety of support groups  336- I7437963 Call for more information  Narcotics Anonymous (NA), [facility] [address], [employer] [place]  2 meetings at this location   Statistician         Address  Phone  Notes  [facility] [address],    [place]  [phone]   [facility]  [address], [place] [address], [place], [place] [phone]   [facility] [address] [place], [place] [phone] Admissions: 8am-3pm M-F  [facility] [address]    [place], [place] [phone]   [facility] [address] [place], [place], [place] [phone]   [facility] [address],  [place], [place] [phone]   [facility] - Intensive Outpatient [address], [place], [place] [phone]   [facility] (Addiction Recovery [facility] [address]  [place], [place] [phone] or [phone]   [facility] ([facility]) [address], [place], [place] [phone] Accepts Medicaid  [facility] [address],    [place] [phone] Substance Abuse/Addiction Treatment   [facility]) Organization         Address  Phone  Notes  [facility]  [phone]   Angie Fava, PhD [address] Ervin Knack [place], [place]   [phone] or [phone]   [facility]   [address] [place], [place] [phone]   [facility] [address] [place], [place] [phone] Insurance/Medicaid/sponsorship through [facility] and Families [address]                                    [place], [place] [phone] Therapy/tele-psych/case  [facility] [address], [place] [phone]    Dr. Lolly Mustache  [phone]   [facility]  [employer] [facility] Dept. 1) [address] [place] [place] 2) [address] [place] 3)  [address], [place] [phone]  [phone]   [facility] Child Abuse Hotline [phone]) [phone] or [phone] (After Hours)

## 2014-08-12 NOTE — ED Notes (Signed)
Pt. Having posterior neck pain that radiates into  Back of his head .  Also having mid-upper back pain radiates into his rt. shoulder

## 2014-08-12 NOTE — ED Provider Notes (Signed)
CSN: [id number]     Arrival date & time 08/12/14  1639 History  This chart was scribed for non-physician practitioner, Wynetta Emery, PA-C working with Roy Mo, MD by Greggory Stallion, ED scribe. This patient was seen in room TR07C/TR07C and the patient's care was started at 5:39 PM.   Chief Complaint  Patient presents with  . Shoulder Pain   The history is provided by the patient. No language interpreter was used.   HPI Comments: Roy Barry is a 43 y.o. male who presents to the Emergency Department complaining of continued neck pain and back pain that started 5 days ago after a fall. Pt fell off of a 4 foot ladder and hit his head. Denies LOC. [facility] where xrays and head CT were done. All scans were negative. Pt was discharged home with valium, Roxicodone and C-collar. States pain medications helped but he is now out and having continued pain. Denies new injury. Pt has not followed up with an orthopedist.   History reviewed. No pertinent past medical history. History reviewed. No pertinent past surgical history. No family history on file. History  Substance Use Topics  . Smoking status: Current Some Day Smoker    Types: Cigarettes  . Smokeless tobacco: Not on file  . Alcohol Use: Yes     Comment: Occassional    Review of Systems  Musculoskeletal: Positive for arthralgias and neck pain.  All other systems reviewed and are negative.  Allergies  Pork-derived products; Aspirin; Ibuprofen; Thorazine; and Tylenol  Home Medications   Prior to Admission medications   Medication Sig Start Date End Date Taking? Authorizing Provider  diazepam (VALIUM) 5 MG tablet Take 1 tablet (5 mg total) by mouth every 6 (six) hours as needed for anxiety (spasms). 08/07/14  Yes Richardean Canal, MD  oxyCODONE (ROXICODONE) 5 MG immediate release tablet Take 1 tablet (5 mg total) by mouth every 6 (six) hours as needed for severe pain. 08/07/14  Yes Richardean Canal, MD   BP 121/73   Pulse 73  Temp(Src) 98 F (36.7 C)  Resp 18  SpO2 93%  Physical Exam  Nursing note and vitals reviewed. Constitutional: He is oriented to person, place, and time. He appears well-developed and well-nourished. No distress.  HENT:  Head: Normocephalic and atraumatic.  Mouth/Throat: Oropharynx is clear and moist.  Eyes: Conjunctivae and EOM are normal.  Neck: Normal range of motion.  No midline C-spine  tenderness to palpation or step-offs appreciated. Patient has full range of motion without pain.   Cardiovascular: Normal rate, regular rhythm and intact distal pulses.   Pulmonary/Chest: Effort normal and breath sounds normal. No stridor.  Abdominal: Soft. Bowel sounds are normal.  Musculoskeletal: Normal range of motion.  Left trapezius spasm.   Left shoulder:  Shoulder with no deformity. FROM to shoulder and elbow. No TTP of rotator cuff musculature. Drop arm negative. Neurovascularly intact   Neurological: He is alert and oriented to person, place, and time.  Follows commands, Clear, goal oriented speech, Strength is 5 out of 5x4 extremities, patient ambulates with a coordinated in nonantalgic gait. Sensation is grossly intact.   Psychiatric: He has a normal mood and affect.    ED Course  Procedures (including critical care time)  DIAGNOSTIC STUDIES: Oxygen Saturation is 93% on RA, adequate by my interpretation.    COORDINATION OF CARE: 5:42 PM-Discussed treatment plan which includes robaxin with pt at bedside and pt agreed to plan. Will give pt orthopedic referral  and advised him to follow up.   Labs Review Labs Reviewed - No data to display  Imaging Review No results found.   EKG Interpretation None      MDM   Final diagnoses:  Muscle spasm of back   Filed Vitals:   08/12/14 1651  BP: 121/73  Pulse: 73  Temp: 98 F (36.7 C)  Resp: 18  SpO2: 93%    Medications  methocarbamol (ROBAXIN) tablet 1,000 mg (1,000 mg Oral Given 08/12/14 1755)     Roy Barry is a 43 y.o. male presenting with exacerbation of pain status post slip and fall off ladder approximately 4 days ago. Patient was seen in the ED and had negative C-spine, thoracic and lumbar spine films. Patient has full range of motion to bilateral shoulders today P. does have a trapezius spasm. On chart review I see that patient was not referred to orthopedist. I advised patient that he will need to see an orthopedist for evaluation of his workers comp claim. Patient will be given 2 days off of work and a prescription for Robaxin. Patient is wearing the rigid c-collar that he was given initially by EMS. I will put the patient in a soft C-spine collar.   Evaluation does not show pathology that would require ongoing emergent intervention or inpatient treatment. Pt is hemodynamically stable and mentating appropriately. Discussed findings and plan with patient/guardian, who agrees with care plan. All questions answered. Return precautions discussed and outpatient follow up given.   Discharge Medication List as of 08/12/2014  5:56 PM    START taking these medications   Details  methocarbamol (ROBAXIN) 500 MG tablet Take 2 tablets (1,000 mg total) by mouth 4 (four) times daily as needed (Pain)., Starting 08/12/2014, Until Discontinued, Print        I personally performed the services described in this documentation, which was scribed in my presence. The recorded information has been reviewed and is accurate.  Wynetta Emery, PA-C 08/12/14 2253

## 2014-08-12 NOTE — Progress Notes (Signed)
Orthopedic Tech Progress Note Patient Details:  Roy Barry 06/02/1971 [id number]  Ortho Devices Type of Ortho Device: Soft collar Ortho Device/Splint Location: neck Ortho Device/Splint Interventions: Application   [name] 08/12/2014, 6:02 PM

## 2014-08-17 NOTE — ED Provider Notes (Signed)
Medical screening examination/treatment/procedure(s) were performed by non-physician practitioner and as supervising physician I was immediately available for consultation/collaboration.   EKG Interpretation None        Mirian Mo, MD 08/17/14 [phone]

## 2014-08-20 ENCOUNTER — Emergency Department (HOSPITAL_COMMUNITY)
Admission: EM | Admit: 2014-08-20 | Discharge: 2014-08-20 | Disposition: A | Discharge status: Home / Self Care | Payer: Worker's Compensation | Attending: Emergency Medicine | Admitting: Emergency Medicine

## 2014-08-20 ENCOUNTER — Encounter (HOSPITAL_COMMUNITY): Payer: Self-pay | Admitting: Emergency Medicine

## 2014-08-20 DIAGNOSIS — R51 Headache: Secondary | ICD-10-CM | POA: Diagnosis not present

## 2014-08-20 DIAGNOSIS — F17200 Nicotine dependence, unspecified, uncomplicated: Secondary | ICD-10-CM | POA: Diagnosis not present

## 2014-08-20 DIAGNOSIS — R5381 Other malaise: Secondary | ICD-10-CM | POA: Insufficient documentation

## 2014-08-20 DIAGNOSIS — M549 Dorsalgia, unspecified: Secondary | ICD-10-CM | POA: Diagnosis not present

## 2014-08-20 DIAGNOSIS — M542 Cervicalgia: Secondary | ICD-10-CM | POA: Diagnosis present

## 2014-08-20 DIAGNOSIS — H539 Unspecified visual disturbance: Secondary | ICD-10-CM | POA: Insufficient documentation

## 2014-08-20 DIAGNOSIS — R5383 Other fatigue: Secondary | ICD-10-CM

## 2014-08-20 DIAGNOSIS — S39012D Strain of muscle, fascia and tendon of lower back, subsequent encounter: Secondary | ICD-10-CM

## 2014-08-20 NOTE — ED Provider Notes (Signed)
CSN: [id number]     Arrival date & time 08/20/14  0631 History   First MD Initiated Contact with Patient 08/20/14 [phone]     Chief Complaint  Patient presents with  . Neck Pain     (Consider location/radiation/quality/duration/timing/severity/associated sxs/prior Treatment) Patient is a [age] y.o. male presenting with neck pain.  Neck Pain Associated symptoms: headaches (posterior)   Associated symptoms: no chest pain, no numbness and no weakness    Pt is a 43 y/o male w/ no significant PMHx who presents to the ED w/ neck and back stiffness and pain. He fell backwards off a 4 foot ladder from the 4th step on 08/07/14. He has been seen for this in the ED previously and was given oxycodone, flexeril, and robaxin. He states oxycodone and flexeril have helped w/pain.  He is currently out of flexeril. Since his fall at work pt has noticed blurry vision only in his right eye and rt hand tingling. Pt describes blurry vision as a fogginess. His neck pain and back pain have improved but not significantly. He reports using his c-collar helps w/ neck stiffness. Back and neck pain are both 7/10 in severity and non radiating. He is requesting referral to a specialist.  He has not worked w/ physical therapy yet as he is waiting for his worker's comp to go through. Also requesting work and school note.   History reviewed. No pertinent past medical history. No past surgical history on file. No family history on file. History  Substance Use Topics  . Smoking status: Current Some Day Smoker    Types: Cigarettes, Cigars  . Smokeless tobacco: Not on file  . Alcohol Use: Yes     Comment: denies 08/20/2014    Review of Systems  Constitutional: Positive for fatigue.  Eyes: Positive for visual disturbance.  Respiratory: Negative for shortness of breath.   Cardiovascular: Negative for chest pain.  Musculoskeletal: Positive for back pain and neck pain.  Neurological: Positive for headaches (posterior). Negative  for weakness and numbness.      Allergies  Pork-derived products; Aspirin; Ibuprofen; Thorazine; and Tylenol  Home Medications   Prior to Admission medications   Medication Sig Start Date End Date Taking? Authorizing Provider  diazepam (VALIUM) 5 MG tablet Take 1 tablet (5 mg total) by mouth every 6 (six) hours as needed for anxiety (spasms). 08/07/14   Richardean Canalavid H Yao, MD  methocarbamol (ROBAXIN) 500 MG tablet Take 2 tablets (1,000 mg total) by mouth 4 (four) times daily as needed (Pain). 08/12/14   Nicole Pisciotta, PA-C  oxyCODONE (ROXICODONE) 5 MG immediate release tablet Take 1 tablet (5 mg total) by mouth every 6 (six) hours as needed for severe pain. 08/07/14   Richardean Canalavid H Yao, MD   BP 115/73  Pulse 88  Temp(Src) 98.2 F (36.8 C) (Oral)  Resp 18  Ht 6' (1.829 m)  Wt 160 lb (72.576 kg)  BMI 21.70 kg/m2  SpO2 100% Physical Exam  Constitutional: He appears well-developed and well-nourished. No distress.  Eyes: Conjunctivae and EOM are normal. Pupils are equal, round, and reactive to light. Right eye exhibits no discharge. Left eye exhibits no discharge.  Able to read snellen chart at arms length at 20/20 line in both eyes  Neck: Normal range of motion.  Tender to palpation of posterior neck, no bruises.   Cardiovascular: Normal rate and regular rhythm.   No murmur heard. Pulmonary/Chest: Effort normal and breath sounds normal. He has no wheezes.  Abdominal: Soft. Bowel sounds  are normal.  Musculoskeletal:  Tender to palpation of thoracic spine, no step offs  Neurological:  Equal strength in upper extremities b/l, intact sensation of rt hand  Skin: Skin is warm and dry. No rash noted.    ED Course  Procedures (including critical care time)   MDM   Final diagnoses:  Back strain, subsequent encounter    Pt presents to the ED w/ neck and back pain and stiffness. This has been going on since 9/16 when he had a work fall. Since then pain has improved but not much. Pt reports  new tingling in rt hand and blurry vision in rt hand. Eye exam WNL w/ 20/20 vision in both eyes, intact sensation and strength in rt hand. Likely pt has muscle strain from fall as CT imaging of neck and spine after his fall was negative for any fractures. Will give oxycodone and flexeril for pain and muscle strain. Instructed to apply ice packs to neck and back. Given phone number to neck and spine specialist.     Gara Kroner, MD 08/20/14 [phone]

## 2014-08-20 NOTE — Discharge Instructions (Signed)
You can take oxycodone 5mg  1 tab every 6 hours as needed for pain. Take flexeril 10mg  up to three times a day as needed for muscle pain.   Back Pain, Adult Low back pain is very common. About 1 in 5 people have back pain.The cause of low back pain is rarely dangerous. The pain often gets better over time.About half of people with a sudden onset of back pain feel better in just 2 weeks. About 8 in 10 people feel better by 6 weeks.  CAUSES Some common causes of back pain include:  Strain of the muscles or ligaments supporting the spine.  Wear and tear (degeneration) of the spinal discs.  Arthritis.  Direct injury to the back. DIAGNOSIS Most of the time, the direct cause of low back pain is not known.However, back pain can be treated effectively even when the exact cause of the pain is unknown.Answering your caregiver's questions about your overall health and symptoms is one of the most accurate ways to make sure the cause of your pain is not dangerous. If your caregiver needs more information, he or she may order lab work or imaging tests (X-rays or MRIs).However, even if imaging tests show changes in your back, this usually does not require surgery. HOME CARE INSTRUCTIONS For many people, back pain returns.Since low back pain is rarely dangerous, it is often a condition that people can learn to [facility] their own.   Remain active. It is stressful on the back to sit or stand in one place. Do not sit, drive, or stand in one place for more than 30 minutes at a time. Take short walks on level surfaces as soon as pain allows.Try to increase the length of time you walk each day.  Do not stay in bed.Resting more than 1 or 2 days can delay your recovery.  Do not avoid exercise or work.Your body is made to move.It is not dangerous to be active, even though your back may hurt.Your back will likely heal faster if you return to being active before your pain is gone.  Pay attention to your  body when you bend and lift. Many people have less discomfortwhen lifting if they bend their knees, keep the load close to their bodies,and avoid twisting. Often, the most comfortable positions are those that put less stress on your recovering back.  Find a comfortable position to sleep. Use a firm mattress and lie on your side with your knees slightly bent. If you lie on your back, put a pillow under your knees.  Only take over-the-counter or prescription medicines as directed by your caregiver. Over-the-counter medicines to reduce pain and inflammation are often the most helpful.Your caregiver may prescribe muscle relaxant drugs.These medicines help dull your pain so you can more quickly return to your normal activities and healthy exercise.  Put ice on the injured area.  Put ice in a plastic bag.  Place a towel between your skin and the bag.  Leave the ice on for 15-20 minutes, 03-04 times a day for the first 2 to 3 days. After that, ice and heat may be alternated to reduce pain and spasms.  Ask your caregiver about trying back exercises and gentle massage. This may be of some benefit.  Avoid feeling anxious or stressed.Stress increases muscle tension and can worsen back pain.It is important to recognize when you are anxious or stressed and learn ways to manage it.Exercise is a great option. SEEK MEDICAL CARE IF:  You have pain that is  not relieved with rest or medicine.  You have pain that does not improve in 1 week.  You have new symptoms.  You are generally not feeling well. SEEK IMMEDIATE MEDICAL CARE IF:   You have pain that radiates from your back into your legs.  You develop new bowel or bladder control problems.  You have unusual weakness or numbness in your arms or legs.  You develop nausea or vomiting.  You develop abdominal pain.  You feel faint. Document Released: 11/08/2005 Document Revised: 05/09/2012 Document Reviewed: 03/12/2014 [facility] Patient  Information 2015 [place], [place]. This information is not intended to replace advice given to you by your health care provider. Make sure you discuss any questions you have with your health care provider.
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
## 2014-08-20 NOTE — ED Notes (Signed)
Pt reports not limitation with movement but reports ache with movement of right shoulder, back, and neck post falling off ladder 2 weeks. Ago patient seen in ED previously for same complaint. Pt reports taking Flexeril at 0300 but denies other.

## 2014-08-20 NOTE — ED Notes (Addendum)
Patient states he has been on Flexeril and Robaxin for neck pain that radiates into his upper back between his shoulders with right shoulder pain. This is the patient's 3rd visit to the ER for the same problem. Patient states he was referred to a physical therapist but he has not seen them yet. Patient states he is out of Robaxin, and has a couple pills left of the Flexeril, states he took the last dose at 0300. Patient denies taking anything else for pain, states he is allergic to tylenol and asa. Patient is wear soft cervical collar on arrival to ER.

## 2014-11-23 ENCOUNTER — Emergency Department (INDEPENDENT_AMBULATORY_CARE_PROVIDER_SITE_OTHER)
Admission: EM | Admit: 2014-11-23 | Discharge: 2014-11-23 | Disposition: A | Discharge status: Home / Self Care | Payer: BC Managed Care – PPO | Source: Home / Self Care | Attending: Family Medicine | Admitting: Family Medicine

## 2014-11-23 ENCOUNTER — Encounter (HOSPITAL_COMMUNITY): Payer: Self-pay | Admitting: Emergency Medicine

## 2014-11-23 DIAGNOSIS — L738 Other specified follicular disorders: Secondary | ICD-10-CM

## 2014-11-23 LAB — POCT URINALYSIS DIP (DEVICE)
GLUCOSE, UA: NEGATIVE mg/dL
Ketones, ur: NEGATIVE mg/dL
LEUKOCYTES UA: NEGATIVE
NITRITE: NEGATIVE
Protein, ur: NEGATIVE mg/dL
Specific Gravity, Urine: 1.025 (ref 1.005–1.030)
UROBILINOGEN UA: 1 mg/dL (ref 0.0–1.0)
pH: 6 (ref 5.0–8.0)

## 2014-11-23 MED ORDER — CLINDAMYCIN HCL 300 MG PO CAPS: 300.0000 mg | ORAL_CAPSULE | Freq: Three times a day (TID) | ORAL | Status: DC

## 2014-11-23 MED ORDER — LIDOCAINE HCL (PF) 1 % IJ SOLN
INTRAMUSCULAR | Status: AC
  Filled 2014-11-23: qty 5

## 2014-11-23 MED ORDER — CEFTRIAXONE SODIUM 250 MG IJ SOLR
250.0000 mg | Freq: Once | INTRAMUSCULAR | Status: AC
  Administered 2014-11-23: 250 mg via INTRAMUSCULAR

## 2014-11-23 MED ORDER — CEFTRIAXONE SODIUM 250 MG IJ SOLR
INTRAMUSCULAR | Status: AC
  Filled 2014-11-23: qty 250

## 2014-11-23 NOTE — ED Notes (Signed)
C/o  Groin pain and swelling.  Lower pelvic pain.  States pain with urinating.  Urgency/frequency.   Penile discharge.  Symptoms present since Monday.

## 2014-11-23 NOTE — ED Provider Notes (Signed)
CSN: [id number]     Arrival date & time 11/23/14  1709 History   First MD Initiated Contact with Patient 11/23/14 1758     Chief Complaint  Patient presents with  . Groin Pain   (Consider location/radiation/quality/duration/timing/severity/associated sxs/prior Treatment) Patient is a 44 y.o. male presenting with groin pain. The history is provided by the patient.  Groin Pain This is a new problem. The current episode started more than 2 days ago. The problem has been gradually worsening. Pertinent negatives include no chest pain and no abdominal pain. Associated symptoms comments: Pustula r lesions on shaft of penis on right .    History reviewed. No pertinent past medical history. History reviewed. No pertinent past surgical history. History reviewed. No pertinent family history. History  Substance Use Topics  . Smoking status: Current Some Day Smoker    Types: Cigarettes, Cigars  . Smokeless tobacco: Not on file  . Alcohol Use: Yes     Comment: denies 08/20/2014    Review of Systems  Constitutional: Negative.   Cardiovascular: Negative for chest pain.  Gastrointestinal: Negative for abdominal pain.  Genitourinary: Positive for penile swelling and penile pain. Negative for discharge.  Hematological: Positive for adenopathy.    Allergies  Pork-derived products; Aspirin; Ibuprofen; Thorazine; and Tylenol  Home Medications   Prior to Admission medications   Medication Sig Start Date End Date Taking? Authorizing Provider  clindamycin (CLEOCIN) 300 MG capsule Take 1 capsule (300 mg total) by mouth 3 (three) times daily. 11/23/14   Linna Hoff, MD  Cyclobenzaprine HCl (FLEXERIL PO) Take by mouth. Need to verify with pharmacy    Historical Provider, MD   BP 119/71 mmHg  Pulse 69  Temp(Src) 98.9 F (37.2 C) (Oral)  Resp 16  SpO2 96% Physical Exam  Constitutional: He is oriented to person, place, and time. He appears well-developed and well-nourished.  Abdominal: Soft. Bowel  sounds are normal.  Neurological: He is alert and oriented to person, place, and time.  Skin: Skin is warm and dry. There is erythema.  Tender right ing nodes from pustular follicular lesions on shaft of right penis.  Nursing note and vitals reviewed.   ED Course  Procedures (including critical care time) Labs Review Labs Reviewed  POCT URINALYSIS DIP (DEVICE) - Abnormal; Notable for the following:    Bilirubin Urine SMALL (*)    Hgb urine dipstick TRACE (*)    All other components within normal limits  CULTURE, ROUTINE-ABSCESS    Imaging Review No results found.   MDM   1. Bacterial folliculitis        Linna Hoff, MD 11/23/14 [phone]

## 2014-11-23 NOTE — Discharge Instructions (Signed)
Take all of antibiotic and wash regularly and put heat to sores.

## 2014-11-26 NOTE — ED Notes (Signed)
Abscess culture penis: Few Group B strep (S. Agalactiae).  Treated with Cleocin. Dr. Artis FlockKindl shown the result and he wrote "no Rx. needed".

## 2014-11-27 LAB — CULTURE, ROUTINE-ABSCESS: Gram Stain: NONE SEEN

## 2015-01-30 ENCOUNTER — Emergency Department (HOSPITAL_COMMUNITY)
Admission: EM | Admit: 2015-01-30 | Discharge: 2015-01-30 | Disposition: A | Discharge status: Home / Self Care | Payer: Self-pay | Attending: Emergency Medicine | Admitting: Emergency Medicine

## 2015-01-30 ENCOUNTER — Encounter (HOSPITAL_COMMUNITY): Payer: Self-pay

## 2015-01-30 ENCOUNTER — Emergency Department (HOSPITAL_COMMUNITY): Payer: Self-pay

## 2015-01-30 DIAGNOSIS — K5900 Constipation, unspecified: Secondary | ICD-10-CM | POA: Insufficient documentation

## 2015-01-30 DIAGNOSIS — Z792 Long term (current) use of antibiotics: Secondary | ICD-10-CM | POA: Insufficient documentation

## 2015-01-30 DIAGNOSIS — G894 Chronic pain syndrome: Secondary | ICD-10-CM | POA: Insufficient documentation

## 2015-01-30 DIAGNOSIS — R52 Pain, unspecified: Secondary | ICD-10-CM

## 2015-01-30 DIAGNOSIS — Z720 Tobacco use: Secondary | ICD-10-CM | POA: Insufficient documentation

## 2015-01-30 HISTORY — DX: Dorsalgia, unspecified: M54.9

## 2015-01-30 LAB — URINALYSIS, ROUTINE W REFLEX MICROSCOPIC
BILIRUBIN URINE: NEGATIVE
Glucose, UA: NEGATIVE mg/dL
HGB URINE DIPSTICK: NEGATIVE
Ketones, ur: NEGATIVE mg/dL
Leukocytes, UA: NEGATIVE
Nitrite: NEGATIVE
PH: 6 (ref 5.0–8.0)
Protein, ur: NEGATIVE mg/dL
Specific Gravity, Urine: 1.019 (ref 1.005–1.030)
Urobilinogen, UA: 0.2 mg/dL (ref 0.0–1.0)

## 2015-01-30 LAB — CBC WITH DIFFERENTIAL/PLATELET
BASOS ABS: 0 10*3/uL (ref 0.0–0.1)
Basophils Relative: 0 % (ref 0–1)
EOS ABS: 0 10*3/uL (ref 0.0–0.7)
EOS PCT: 1 % (ref 0–5)
HEMATOCRIT: 38.7 % — AB (ref 39.0–52.0)
Hemoglobin: 13.4 g/dL (ref 13.0–17.0)
Lymphocytes Relative: 56 % — ABNORMAL HIGH (ref 12–46)
Lymphs Abs: 1.8 10*3/uL (ref 0.7–4.0)
MCH: 32.5 pg (ref 26.0–34.0)
MCHC: 34.6 g/dL (ref 30.0–36.0)
MCV: 93.9 fL (ref 78.0–100.0)
MONOS PCT: 12 % (ref 3–12)
Monocytes Absolute: 0.4 10*3/uL (ref 0.1–1.0)
NEUTROS PCT: 31 % — AB (ref 43–77)
Neutro Abs: 1 10*3/uL — ABNORMAL LOW (ref 1.7–7.7)
Platelets: 127 10*3/uL — ABNORMAL LOW (ref 150–400)
RBC: 4.12 MIL/uL — ABNORMAL LOW (ref 4.22–5.81)
RDW: 12.8 % (ref 11.5–15.5)
WBC: 3.3 10*3/uL — ABNORMAL LOW (ref 4.0–10.5)

## 2015-01-30 LAB — COMPREHENSIVE METABOLIC PANEL
ALK PHOS: 59 U/L (ref 39–117)
ALT: 16 U/L (ref 0–53)
AST: 28 U/L (ref 0–37)
Albumin: 3.7 g/dL (ref 3.5–5.2)
Anion gap: 5 (ref 5–15)
BUN: 11 mg/dL (ref 6–23)
CALCIUM: 9.1 mg/dL (ref 8.4–10.5)
CHLORIDE: 110 mmol/L (ref 96–112)
CO2: 23 mmol/L (ref 19–32)
CREATININE: 1.06 mg/dL (ref 0.50–1.35)
GFR calc non Af Amer: 86 mL/min — ABNORMAL LOW (ref >90)
GLUCOSE: 106 mg/dL — AB (ref 70–99)
Potassium: 4 mmol/L (ref 3.5–5.1)
SODIUM: 138 mmol/L (ref 135–145)
Total Bilirubin: 0.5 mg/dL (ref 0.3–1.2)
Total Protein: 6.7 g/dL (ref 6.0–8.3)

## 2015-01-30 MED ORDER — POLYETHYLENE GLYCOL 3350 17 GM/SCOOP PO POWD: 17.0000 g | Freq: Every day | ORAL | Status: DC

## 2015-01-30 MED ORDER — GI COCKTAIL ~~LOC~~
30.0000 mL | Freq: Once | ORAL | Status: AC
  Administered 2015-01-30: 30 mL via ORAL
  Filled 2015-01-30: qty 30

## 2015-01-30 MED ORDER — DICYCLOMINE HCL 10 MG/ML IM SOLN
20.0000 mg | Freq: Once | INTRAMUSCULAR | Status: AC
  Administered 2015-01-30: 20 mg via INTRAMUSCULAR
  Filled 2015-01-30: qty 2

## 2015-01-30 MED ORDER — SIMETHICONE 80 MG PO CHEW: 80.0000 mg | CHEWABLE_TABLET | Freq: Four times a day (QID) | ORAL | Status: DC | PRN

## 2015-01-30 NOTE — ED Notes (Signed)
Pt complains of intermittent cramping abd pain for several months with vomiting and diarrhea

## 2015-01-30 NOTE — ED Notes (Signed)
Pt states that spicy foods, acidic foods and milk irritate his stomach

## 2015-01-30 NOTE — ED Provider Notes (Signed)
CSN: [id number]     Arrival date & time 01/30/15  0100 History   First MD Initiated Contact with Patient 01/30/15 0143     Chief Complaint  Patient presents with  . Abdominal Pain     (Consider location/radiation/quality/duration/timing/severity/associated sxs/prior Treatment) Patient is a 44 y.o. male presenting with abdominal pain. The history is provided by the patient.  Abdominal Pain Pain location:  Suprapubic Pain quality: cramping   Pain radiates to:  Does not radiate Pain severity:  Moderate Onset quality:  Gradual Duration:  12 weeks Timing:  Constant Progression:  Waxing and waning Chronicity:  New Context: eating   Relieved by:  Nothing Worsened by:  Nothing tried Ineffective treatments:  None tried Associated symptoms: no chest pain, no constipation, no diarrhea, no fever, no shortness of breath and no vomiting   Risk factors: not pregnant   Denies taking anything for the pain.    Past Medical History  Diagnosis Date  . Back pain    History reviewed. No pertinent past surgical history. History reviewed. No pertinent family history. History  Substance Use Topics  . Smoking status: Current Some Day Smoker    Types: Cigarettes, Cigars  . Smokeless tobacco: Not on file  . Alcohol Use: Yes     Comment: denies 08/20/2014    Review of Systems  Constitutional: Negative for fever.  Respiratory: Negative for chest tightness and shortness of breath.   Cardiovascular: Negative for chest pain, palpitations and leg swelling.  Gastrointestinal: Positive for abdominal pain. Negative for vomiting, diarrhea, constipation and anal bleeding.  All other systems reviewed and are negative.     Allergies  Pork-derived products; Aspirin; Ibuprofen; Thorazine; and Tylenol  Home Medications   Prior to Admission medications   Medication Sig Start Date End Date Taking? Authorizing Provider  clindamycin (CLEOCIN) 300 MG capsule Take 1 capsule (300 mg total) by mouth 3  (three) times daily. 11/23/14   Linna HoffJames D Kindl, MD  Cyclobenzaprine HCl (FLEXERIL PO) Take by mouth. Need to verify with pharmacy    Historical Provider, MD   BP 110/57 mmHg  Pulse 65  Temp(Src) 99.1 F (37.3 C) (Oral)  Resp 16  SpO2 100% Physical Exam  Constitutional: He is oriented to person, place, and time. He appears well-developed and well-nourished. No distress.  HENT:  Head: Normocephalic and atraumatic.  Mouth/Throat: Oropharynx is clear and moist.  Eyes: Conjunctivae are normal. Pupils are equal, round, and reactive to light.  Neck: Normal range of motion. Neck supple.  Cardiovascular: Normal rate and regular rhythm.   Pulmonary/Chest: Effort normal and breath sounds normal. No respiratory distress. He has no wheezes. He has no rales.  Abdominal: Soft. Bowel sounds are increased. There is no tenderness. There is no rigidity, no rebound, no guarding, no tenderness at McBurney's point and negative Murphy's sign.  Musculoskeletal: Normal range of motion.  Neurological: He is alert and oriented to person, place, and time.  Skin: Skin is warm and dry.  Psychiatric: He has a normal mood and affect.    ED Course  Procedures (including critical care time) Labs Review Labs Reviewed  URINALYSIS, ROUTINE W REFLEX MICROSCOPIC    Imaging Review No results found.   EKG Interpretation None      MDM   Final diagnoses:  Pain    Denies being on any medication but fille 60 Oxy IR on 2/29 for chronic pain syndrome  Exam and history are consistent with gas and constipation .  Will start miralax and simethicone  Cy Blamer, MD 01/30/15 [phone]

## 2015-05-14 ENCOUNTER — Encounter (HOSPITAL_COMMUNITY): Payer: Self-pay | Admitting: Emergency Medicine

## 2015-05-14 ENCOUNTER — Emergency Department (INDEPENDENT_AMBULATORY_CARE_PROVIDER_SITE_OTHER)
Admission: EM | Admit: 2015-05-14 | Discharge: 2015-05-14 | Disposition: A | Discharge status: Home / Self Care | Payer: Self-pay | Source: Home / Self Care | Attending: Family Medicine | Admitting: Family Medicine

## 2015-05-14 DIAGNOSIS — K047 Periapical abscess without sinus: Secondary | ICD-10-CM

## 2015-05-14 MED ORDER — CLINDAMYCIN HCL 300 MG PO CAPS: 300.0000 mg | ORAL_CAPSULE | Freq: Three times a day (TID) | ORAL | Status: DC

## 2015-05-14 NOTE — ED Notes (Signed)
Right side of face swelling/gums

## 2015-05-14 NOTE — ED Provider Notes (Signed)
CSN: [id number]     Arrival date & time 05/14/15  1438 History   First MD Initiated Contact with Patient 05/14/15 1446     Chief Complaint  Patient presents with  . Facial Swelling   (Consider location/radiation/quality/duration/timing/severity/associated sxs/prior Treatment) Patient is a 44 y.o. male presenting with tooth pain. The history is provided by the patient.  Dental Pain Location:  Upper Upper teeth location:  5/RU 1st bicuspid Quality:  Throbbing Severity:  Moderate Onset quality:  Sudden Duration:  3 days Chronicity:  New Context: abscess, dental caries and poor dentition   Associated symptoms: facial pain, facial swelling and gum swelling   Associated symptoms: no fever, no neck swelling and no trismus   Risk factors: lack of dental care and periodontal disease     Past Medical History  Diagnosis Date  . Back pain    History reviewed. No pertinent past surgical history. No family history on file. History  Substance Use Topics  . Smoking status: Current Some Day Smoker    Types: Cigarettes, Cigars  . Smokeless tobacco: Not on file  . Alcohol Use: Yes     Comment: denies 08/20/2014    Review of Systems  Constitutional: Negative.  Negative for fever.  HENT: Positive for dental problem and facial swelling.     Allergies  Other; Pork-derived products; Aspirin; Ibuprofen; Thorazine; and Tylenol  Home Medications   Prior to Admission medications   Medication Sig Start Date End Date Taking? Authorizing Provider  clindamycin (CLEOCIN) 300 MG capsule Take 1 capsule (300 mg total) by mouth 3 (three) times daily. 05/14/15   Linna Hoff, MD  oxyCODONE (OXY IR/ROXICODONE) 5 MG immediate release tablet Take 5-10 mg by mouth at bedtime as needed for moderate pain or severe pain.    Historical Provider, MD  polyethylene glycol powder (MIRALAX) powder Take 17 g by mouth daily. 01/30/15   April Palumbo, MD  simethicone (GAS-X) 80 MG chewable tablet Chew 1 tablet (80 mg  total) by mouth every 6 (six) hours as needed for flatulence. 01/30/15   April Palumbo, MD   BP 117/69 mmHg  Pulse 80  Temp(Src) 98.2 F (36.8 C)  Resp 12  SpO2 100% Physical Exam  Constitutional: He is oriented to person, place, and time. He appears well-developed and well-nourished. No distress.  HENT:  Head: Normocephalic.  Right Ear: External ear normal.  Left Ear: External ear normal.  Mouth/Throat:    Eyes: Conjunctivae and EOM are normal. Pupils are equal, round, and reactive to light.  Neck: Normal range of motion. Neck supple.  Lymphadenopathy:    He has no cervical adenopathy.  Neurological: He is alert and oriented to person, place, and time.  Skin: Skin is warm and dry.  Nursing note and vitals reviewed.   ED Course  Procedures (including critical care time) Labs Review Labs Reviewed - No data to display  Imaging Review No results found.   MDM   1. Abscess, dental        Linna Hoff, MD 05/14/15 [phone]

## 2015-05-14 NOTE — Discharge Instructions (Signed)
Take medicine as prescribed, see your dentist as soon as possible °

## 2015-06-10 ENCOUNTER — Encounter (HOSPITAL_BASED_OUTPATIENT_CLINIC_OR_DEPARTMENT_OTHER): Payer: Self-pay | Admitting: *Deleted

## 2015-06-10 ENCOUNTER — Emergency Department (HOSPITAL_BASED_OUTPATIENT_CLINIC_OR_DEPARTMENT_OTHER)
Admission: EM | Admit: 2015-06-10 | Discharge: 2015-06-10 | Disposition: A | Discharge status: Home / Self Care | Payer: Worker's Compensation | Attending: Emergency Medicine | Admitting: Emergency Medicine

## 2015-06-10 DIAGNOSIS — Z87828 Personal history of other (healed) physical injury and trauma: Secondary | ICD-10-CM | POA: Insufficient documentation

## 2015-06-10 DIAGNOSIS — G8929 Other chronic pain: Secondary | ICD-10-CM | POA: Insufficient documentation

## 2015-06-10 DIAGNOSIS — M545 Low back pain, unspecified: Secondary | ICD-10-CM

## 2015-06-10 DIAGNOSIS — Z720 Tobacco use: Secondary | ICD-10-CM | POA: Insufficient documentation

## 2015-06-10 MED ORDER — OXYCODONE HCL 5 MG PO TABS
5.0000 mg | ORAL_TABLET | Freq: Once | ORAL | Status: DC
  Filled 2015-06-10: qty 1

## 2015-06-10 MED ORDER — OXYCODONE HCL 5 MG PO TABS
5.0000 mg | ORAL_TABLET | ORAL | Status: AC | PRN
Start: 2015-06-10 — End: ?

## 2015-06-10 NOTE — ED Notes (Signed)
Pt states he is unsure if he has a ride home. Pt assisted to call his friend for ride home. oxydocone held until pt secures a ride home.

## 2015-06-10 NOTE — ED Notes (Signed)
Patient not in room for discharge vitals 
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
## 2015-06-10 NOTE — ED Notes (Signed)
Pt not currently in waiting area. Per charge nurse advisement, pt d/c from ED.

## 2015-06-10 NOTE — Discharge Instructions (Signed)
Return to the ED with any concerns including weakness of legs, not able to urinate, loss of control of bowel or bladder, decreased level of alertness/lethargy, or any other alarming symptoms °

## 2015-06-10 NOTE — ED Notes (Signed)
Pt amb to vending machines and around ER, waiting area, and bistro with quick steady gait smiling and laughing with registration staff and this rn in nad. Pt states his ride will be here within an hour, rates his pain at 10/10.

## 2015-06-10 NOTE — ED Notes (Signed)
Pt calls this rn to waiting area to report that he is hungry and plans to walk to a nearby restaurant. Pt informed that if he leaves this building I will have to discharge him from the computer and he will not be able to get his pain medication. Pt verbalizes understanding and states "OK I'll stay here, I need that pain medicine before I leave." Pt offered soup and snacks from our kitchen, states "no, I need some real food". Registration staff will alert this rn if pt leaves waiting area.
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
## 2015-11-16 ENCOUNTER — Encounter (HOSPITAL_BASED_OUTPATIENT_CLINIC_OR_DEPARTMENT_OTHER): Payer: Self-pay | Admitting: Emergency Medicine

## 2015-11-16 ENCOUNTER — Emergency Department (HOSPITAL_BASED_OUTPATIENT_CLINIC_OR_DEPARTMENT_OTHER)
Admission: EM | Admit: 2015-11-16 | Discharge: 2015-11-16 | Disposition: A | Discharge status: Home / Self Care | Payer: No Typology Code available for payment source | Attending: Emergency Medicine | Admitting: Emergency Medicine

## 2015-11-16 DIAGNOSIS — F17210 Nicotine dependence, cigarettes, uncomplicated: Secondary | ICD-10-CM | POA: Diagnosis not present

## 2015-11-16 DIAGNOSIS — S4992XA Unspecified injury of left shoulder and upper arm, initial encounter: Secondary | ICD-10-CM | POA: Insufficient documentation

## 2015-11-16 DIAGNOSIS — G8929 Other chronic pain: Secondary | ICD-10-CM | POA: Diagnosis not present

## 2015-11-16 DIAGNOSIS — Y998 Other external cause status: Secondary | ICD-10-CM | POA: Insufficient documentation

## 2015-11-16 DIAGNOSIS — S3992XA Unspecified injury of lower back, initial encounter: Secondary | ICD-10-CM | POA: Diagnosis not present

## 2015-11-16 DIAGNOSIS — Y9389 Activity, other specified: Secondary | ICD-10-CM | POA: Diagnosis not present

## 2015-11-16 DIAGNOSIS — Y92410 Unspecified street and highway as the place of occurrence of the external cause: Secondary | ICD-10-CM | POA: Insufficient documentation

## 2015-11-16 DIAGNOSIS — S29002A Unspecified injury of muscle and tendon of back wall of thorax, initial encounter: Secondary | ICD-10-CM | POA: Insufficient documentation

## 2015-11-16 MED ORDER — KETOROLAC TROMETHAMINE 60 MG/2ML IM SOLN
60.0000 mg | Freq: Once | INTRAMUSCULAR | Status: DC
  Filled 2015-11-16: qty 2

## 2015-11-16 MED ORDER — METHOCARBAMOL 500 MG PO TABS: 500.0000 mg | ORAL_TABLET | Freq: Two times a day (BID) | ORAL | Status: DC

## 2015-11-16 MED ORDER — TRAMADOL HCL 50 MG PO TABS
100.0000 mg | ORAL_TABLET | Freq: Once | ORAL | Status: DC
Start: 2015-11-16 — End: 2015-11-16

## 2015-11-16 MED ORDER — TRAMADOL HCL 50 MG PO TABS: 50.0000 mg | ORAL_TABLET | Freq: Four times a day (QID) | ORAL | Status: AC | PRN

## 2015-11-16 MED ORDER — TRAMADOL HCL 50 MG PO TABS
50.0000 mg | ORAL_TABLET | Freq: Four times a day (QID) | ORAL | Status: DC | PRN
Start: 2015-11-16 — End: 2015-11-16

## 2015-11-16 NOTE — ED Notes (Signed)
Pa  at bedside. 
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
## 2016-06-17 ENCOUNTER — Emergency Department (HOSPITAL_COMMUNITY)
Admission: EM | Admit: 2016-06-17 | Discharge: 2016-06-17 | Disposition: A | Discharge status: Home / Self Care | Payer: No Typology Code available for payment source | Attending: Emergency Medicine | Admitting: Emergency Medicine

## 2016-06-17 ENCOUNTER — Emergency Department (HOSPITAL_COMMUNITY): Payer: No Typology Code available for payment source

## 2016-06-17 ENCOUNTER — Encounter (HOSPITAL_COMMUNITY): Payer: Self-pay | Admitting: Emergency Medicine

## 2016-06-17 DIAGNOSIS — S161XXA Strain of muscle, fascia and tendon at neck level, initial encounter: Secondary | ICD-10-CM | POA: Diagnosis not present

## 2016-06-17 DIAGNOSIS — S199XXA Unspecified injury of neck, initial encounter: Secondary | ICD-10-CM | POA: Diagnosis present

## 2016-06-17 DIAGNOSIS — Z79899 Other long term (current) drug therapy: Secondary | ICD-10-CM | POA: Diagnosis not present

## 2016-06-17 DIAGNOSIS — F17210 Nicotine dependence, cigarettes, uncomplicated: Secondary | ICD-10-CM | POA: Insufficient documentation

## 2016-06-17 DIAGNOSIS — Y92410 Unspecified street and highway as the place of occurrence of the external cause: Secondary | ICD-10-CM | POA: Insufficient documentation

## 2016-06-17 DIAGNOSIS — Y999 Unspecified external cause status: Secondary | ICD-10-CM | POA: Diagnosis not present

## 2016-06-17 DIAGNOSIS — Y939 Activity, unspecified: Secondary | ICD-10-CM | POA: Insufficient documentation

## 2016-06-17 DIAGNOSIS — M79645 Pain in left finger(s): Secondary | ICD-10-CM

## 2016-06-17 MED ORDER — DICLOFENAC SODIUM 1 % TD GEL: 2.0000 g | Freq: Four times a day (QID) | TRANSDERMAL | 0 refills | Status: AC

## 2016-06-17 NOTE — ED Notes (Signed)
Pt to xray

## 2016-06-17 NOTE — ED Notes (Signed)
Patient is resting comfortably. 

## 2016-06-17 NOTE — ED Notes (Signed)
Pt back from x-ray.
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
## 2016-06-17 NOTE — ED Notes (Signed)
Patient able to ambulate independently  
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
## 2016-09-15 ENCOUNTER — Ambulatory Visit (HOSPITAL_COMMUNITY)
Admission: EM | Admit: 2016-09-15 | Discharge: 2016-09-15 | Disposition: A | Discharge status: Home / Self Care | Payer: Self-pay | Attending: Physician Assistant | Admitting: Physician Assistant

## 2016-09-15 ENCOUNTER — Encounter (HOSPITAL_COMMUNITY): Payer: Self-pay | Admitting: Emergency Medicine

## 2016-09-15 ENCOUNTER — Ambulatory Visit (HOSPITAL_COMMUNITY): Payer: Self-pay

## 2016-09-15 DIAGNOSIS — M545 Low back pain, unspecified: Secondary | ICD-10-CM

## 2016-09-15 DIAGNOSIS — G8929 Other chronic pain: Secondary | ICD-10-CM

## 2016-09-15 MED ORDER — LIDOCAINE 5 % EX PTCH: 1.0000 | MEDICATED_PATCH | CUTANEOUS | 0 refills | Status: AC

## 2016-09-15 MED ORDER — TRAMADOL HCL 50 MG PO TABS: 50.0000 mg | ORAL_TABLET | Freq: Four times a day (QID) | ORAL | 0 refills | Status: AC | PRN
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
